# Patient Record
Sex: Female | Born: 1965 | Race: White | Hispanic: No | Smoking: Former smoker
Health system: Southern US, Community
[De-identification: ages and names within clinical notes are randomized; demographics above are authoritative.]

## PROBLEM LIST (undated history)

## (undated) DIAGNOSIS — T7840XA Allergy, unspecified, initial encounter: Secondary | ICD-10-CM

## (undated) DIAGNOSIS — IMO0002 Reserved for concepts with insufficient information to code with codable children: Secondary | ICD-10-CM

## (undated) DIAGNOSIS — R87810 Cervical high risk human papillomavirus (HPV) DNA test positive: Principal | ICD-10-CM

## (undated) DIAGNOSIS — F419 Anxiety disorder, unspecified: Secondary | ICD-10-CM

## (undated) DIAGNOSIS — J302 Other seasonal allergic rhinitis: Secondary | ICD-10-CM

## (undated) DIAGNOSIS — R87610 Atypical squamous cells of undetermined significance on cytologic smear of cervix (ASC-US): Secondary | ICD-10-CM

## (undated) HISTORY — PX: FOOT SURGERY: SHX648

## (undated) HISTORY — DX: Other seasonal allergic rhinitis: J30.2

## (undated) HISTORY — DX: Anxiety disorder, unspecified: F41.9

## (undated) HISTORY — DX: Allergy, unspecified, initial encounter: T78.40XA

## (undated) HISTORY — PX: GYNECOLOGIC CRYOSURGERY: SHX857

## (undated) HISTORY — DX: Cervical high risk human papillomavirus (HPV) DNA test positive: R87.810

## (undated) HISTORY — DX: Reserved for concepts with insufficient information to code with codable children: IMO0002

## (undated) HISTORY — PX: WISDOM TOOTH EXTRACTION: SHX21

## (undated) HISTORY — DX: Atypical squamous cells of undetermined significance on cytologic smear of cervix (ASC-US): R87.610

## (undated) NOTE — ED Notes (Signed)
 Formatting of this note might be different from the original. Norleen Resides : [phone]  Local friend, call if need anything.   Ferol Alstrom, RN 04/05/24 1306  Electronically signed by Ferol Alstrom, RN at 04/05/2024  1:06 PM EDT

## (undated) NOTE — ED Provider Notes (Signed)
 Formatting of this note is different from the original. HPI  Chief Complaint  Patient presents with   Vomiting    BIBA from [facility] for N/V/D since 5 am this morning. Pt is out of town from [facility], here for an event, was at the [employer] including meal, no other reports of illness. No abdominal surgical history. 18g IV RAC placed by EMS, 4 mg PO Zofran  and 4 mg IV  Zofran  PTA.   This is a 6 year old female who presents with nausea vomiting and diarrhea since early this morning, notes that she is here from out of town, and ate food with many other people at the [employer] last night, no one else she knows got sick, she denies any blood in her vomit or stool, she does endorse intermittent mid to right lower abdominal pain denies any pelvic or abdominal surgeries denies chills sweats chest pain shortness of breath.  Denies any recent significant foreign travel or antibiotic use, typically does not have GI issues.  Got Zofran  x 2 prior to my evaluation without relief of her nausea.  Patient History  History reviewed. No pertinent past medical history. History reviewed. No pertinent surgical history. No family history on file. Social History   Tobacco Use   Smoking status: Unknown   Smokeless tobacco: Not on file  Substance Use Topics   Alcohol  use: Not on file   Drug use: Not on file   Review of Systems  Review of Systems  All other systems reviewed and are negative.  Physical Exam  ED Triage Vitals [04/05/24 1129]  Temp Heart Rate Resp BP  36.6 C (97.8 F) 87 16 104/64   SpO2 Temp Source Heart Rate Source Patient Position  98 % Oral SpO2 Lying   BP Location FiO2 (%)    Right arm --     Physical Exam Vitals and nursing note reviewed.   Physical Exam: General: Patient is well-developed and well-nourished in no acute distress. HEENT: Normocephalic atraumatic. Airway patent.   Neck: Neck is supple. Cardiovascular: Warm and well perfused.  Regular rate and  rhythm Respiratory: Breathing comfortably without respiratory distress or tachypnea.   Abdomen: Soft, minimal tenderness in the right lower quadrant, nondistended Extremities: No cyanosis or edema.  Musculoskeletal: No gross deformities. Neuro: GCS 15.  No focal deficits apparent. Psych: General behavior and level of consciousness normal.  Skin: Warm and dry. No rashes or lesions noted.  Medical Decision Making 71 year old female coming in with nausea vomiting diarrhea since 5:00 this morning, mild lower abdominal pain primarily mid and right-sided, typically does not have GI issues, no history of abdominal or pelvic surgeries.  On exam she appears mildly fatigued but in no distress she has very minimal tenderness to the right lower quadrant, I have low suspicion for appendicitis at this could be colitis or other intra-abdominal pathology given the tenderness and the fact that she is still nauseous and vomiting after Reglan and Zofran  I did order CT scan.  I gave additional Compazine, CT scan does reveal a colitis, which I am going to cover with antibiotics in case it is bacterial given that she is no history of this, and after the third antiemetic patient is feeling improved clinically she is much better appearing she is able to take p.o. I am discharging her at this time with strict return precautions a prescription for ciprofloxacin  and Flagyl , I did discuss no alcohol  consumption while on Flagyl , and a prescription for Zofran .  She is going to  follow-up with her PCP at home.  No further events in my care.  Diagnoses as of 04/05/24 1720  Colitis             Glasgow Coma Scale Score: [address] MD 04/05/24 1723  Electronically signed by Heather Arlean Ponds, MD at 04/05/2024  5:23 PM EDT

---

## 1999-04-07 ENCOUNTER — Other Ambulatory Visit: Admission: RE | Admit: 1999-04-07 | Discharge: 1999-04-07 | Payer: Self-pay | Admitting: Gynecology

## 1999-08-26 ENCOUNTER — Encounter: Admission: RE | Admit: 1999-08-26 | Discharge: 1999-11-24 | Payer: Self-pay | Admitting: Gynecology

## 1999-11-07 ENCOUNTER — Observation Stay (HOSPITAL_COMMUNITY): Admission: AD | Admit: 1999-11-07 | Discharge: 1999-11-08 | Payer: Self-pay | Admitting: Gynecology

## 1999-11-10 ENCOUNTER — Inpatient Hospital Stay (HOSPITAL_COMMUNITY): Admission: AD | Admit: 1999-11-10 | Discharge: 1999-11-13 | Payer: Self-pay | Admitting: Gynecology

## 1999-11-16 ENCOUNTER — Encounter: Admission: RE | Admit: 1999-11-16 | Discharge: 2000-02-14 | Payer: Self-pay | Admitting: Gynecology

## 1999-12-23 ENCOUNTER — Other Ambulatory Visit: Admission: RE | Admit: 1999-12-23 | Discharge: 1999-12-23 | Payer: Self-pay | Admitting: Gynecology

## 2000-10-08 ENCOUNTER — Emergency Department (HOSPITAL_COMMUNITY): Admission: EM | Admit: 2000-10-08 | Discharge: 2000-10-08 | Payer: Self-pay | Admitting: Emergency Medicine

## 2001-01-12 ENCOUNTER — Other Ambulatory Visit: Admission: RE | Admit: 2001-01-12 | Discharge: 2001-01-12 | Payer: Self-pay | Admitting: Gynecology

## 2002-01-15 ENCOUNTER — Other Ambulatory Visit: Admission: RE | Admit: 2002-01-15 | Discharge: 2002-01-15 | Payer: Self-pay | Admitting: Gynecology

## 2002-10-07 ENCOUNTER — Inpatient Hospital Stay (HOSPITAL_COMMUNITY): Admission: AD | Admit: 2002-10-07 | Discharge: 2002-10-09 | Payer: Self-pay | Admitting: Gynecology

## 2002-11-20 ENCOUNTER — Other Ambulatory Visit: Admission: RE | Admit: 2002-11-20 | Discharge: 2002-11-20 | Payer: Self-pay | Admitting: Gynecology

## 2004-04-22 ENCOUNTER — Other Ambulatory Visit: Admission: RE | Admit: 2004-04-22 | Discharge: 2004-04-22 | Payer: Self-pay | Admitting: Gynecology

## 2004-11-06 ENCOUNTER — Other Ambulatory Visit: Admission: RE | Admit: 2004-11-06 | Discharge: 2004-11-06 | Payer: Self-pay | Admitting: Gynecology

## 2005-01-08 ENCOUNTER — Encounter (INDEPENDENT_AMBULATORY_CARE_PROVIDER_SITE_OTHER): Payer: Self-pay | Admitting: Specialist

## 2005-01-08 ENCOUNTER — Ambulatory Visit (HOSPITAL_COMMUNITY): Admission: RE | Admit: 2005-01-08 | Discharge: 2005-01-08 | Payer: Self-pay | Admitting: Gastroenterology

## 2005-04-26 ENCOUNTER — Other Ambulatory Visit: Admission: RE | Admit: 2005-04-26 | Discharge: 2005-04-26 | Payer: Self-pay | Admitting: Gynecology

## 2006-04-28 ENCOUNTER — Other Ambulatory Visit: Admission: RE | Admit: 2006-04-28 | Discharge: 2006-04-28 | Payer: Self-pay | Admitting: Gynecology

## 2007-05-11 ENCOUNTER — Other Ambulatory Visit: Admission: RE | Admit: 2007-05-11 | Discharge: 2007-05-11 | Payer: Self-pay | Admitting: Gynecology

## 2008-05-15 ENCOUNTER — Encounter: Payer: Self-pay | Admitting: Women's Health

## 2008-05-15 ENCOUNTER — Ambulatory Visit: Payer: Self-pay | Admitting: Women's Health

## 2008-05-15 ENCOUNTER — Other Ambulatory Visit: Admission: RE | Admit: 2008-05-15 | Discharge: 2008-05-15 | Payer: Self-pay | Admitting: Gynecology

## 2009-06-25 ENCOUNTER — Ambulatory Visit: Payer: Self-pay | Admitting: Women's Health

## 2009-06-25 ENCOUNTER — Encounter: Payer: Self-pay | Admitting: Women's Health

## 2009-06-25 ENCOUNTER — Other Ambulatory Visit: Admission: RE | Admit: 2009-06-25 | Discharge: 2009-06-25 | Payer: Self-pay | Admitting: Gynecology

## 2010-10-21 ENCOUNTER — Encounter: Payer: Self-pay | Admitting: Women's Health

## 2010-10-28 ENCOUNTER — Encounter: Payer: Self-pay | Admitting: Women's Health

## 2010-12-04 ENCOUNTER — Encounter: Payer: Self-pay | Admitting: Women's Health

## 2010-12-09 ENCOUNTER — Encounter: Payer: Self-pay | Admitting: Women's Health

## 2010-12-11 ENCOUNTER — Other Ambulatory Visit: Payer: Self-pay | Admitting: Women's Health

## 2010-12-11 ENCOUNTER — Other Ambulatory Visit (HOSPITAL_COMMUNITY)
Admission: RE | Admit: 2010-12-11 | Discharge: 2010-12-11 | Disposition: A | Discharge status: Home / Self Care | Payer: Commercial Managed Care - PPO | Source: Ambulatory Visit | Attending: Gynecology | Admitting: Gynecology

## 2010-12-11 ENCOUNTER — Encounter (INDEPENDENT_AMBULATORY_CARE_PROVIDER_SITE_OTHER): Payer: Commercial Managed Care - PPO | Admitting: Women's Health

## 2010-12-11 DIAGNOSIS — Z01419 Encounter for gynecological examination (general) (routine) without abnormal findings: Secondary | ICD-10-CM

## 2010-12-11 DIAGNOSIS — Z13220 Encounter for screening for lipoid disorders: Secondary | ICD-10-CM

## 2010-12-11 DIAGNOSIS — Z3041 Encounter for surveillance of contraceptive pills: Secondary | ICD-10-CM

## 2010-12-11 DIAGNOSIS — Z113 Encounter for screening for infections with a predominantly sexual mode of transmission: Secondary | ICD-10-CM

## 2010-12-11 DIAGNOSIS — R82998 Other abnormal findings in urine: Secondary | ICD-10-CM

## 2010-12-11 DIAGNOSIS — Z124 Encounter for screening for malignant neoplasm of cervix: Secondary | ICD-10-CM | POA: Insufficient documentation

## 2010-12-11 DIAGNOSIS — B373 Candidiasis of vulva and vagina: Secondary | ICD-10-CM

## 2011-01-29 NOTE — H&P (Signed)
[facility]  Patient:    Mackenzie Thomas, Mackenzie Thomas                       MRN: [id number] Adm. Date:  [id number] Disc. Date: [id number] Attending:  Merrily Pew                         History and Physical  CHIEF COMPLAINT:              Pregnancy at term, spontaneous rupture of membranes, early labor.  HISTORY OF PRESENT ILLNESS:   A 45 year old G1 P0 female at term gestation, admitted with history of spontaneous rupture of membranes, regular uterine contractions, and 3 cm dilatation.  She is admitted for labor and delivery. Patients prenatal course noted the patient is rubella negative and she does have a history of cryosurgery in the past.  Her prenatal course is otherwise uncomplicated, as evidenced by her accompanying [place] form.  PAST MEDICAL HISTORY:         Uncomplicated.  PAST SURGICAL HISTORY:        Includes cryosurgery to the cervix, orthopedic toe surgery, and oral surgery.  MEDICATIONS:                  Vitamins.  ALLERGIES:                    None.  REVIEW OF SYSTEMS:            Noncontributory.  FAMILY HISTORY:               Noncontributory.  ADMISSION PHYSICAL EXAMINATION:  VITAL SIGNS:                  Afebrile, vital signs stable.  HEENT:                        Normal.  LUNGS:                        Clear.  CARDIAC:                      Regular rate, no rubs, murmurs, or gallops.  ABDOMEN:                      Gravid, vertex uterus, reactive fetal tracing. Contractions every three minutes.  PELVIC:                       Complete, 6 cm, 0 station.  ASSESSMENT:                   A 45 year old G1 P0 female, term gestation, admitted a 3 cm dilatation with spontaneous rupture of membranes, regular contractions, ow in active labor at 6 cm with anticipated labor and delivery. DD:  12/02/99 TD:  12/02/99 Job: [id number]

## 2011-01-29 NOTE — Op Note (Signed)
NAME:  Mackenzie Thomas, CHAM NO.:  [id number]   MEDICAL RECORD NO.:  [id number]          PATIENT TYPE:  AMB   LOCATION:  ENDO                         FACILITY:  [facility]   PHYSICIAN:  Graylin Shiver, M.D.   DATE OF BIRTH:  1965-10-11   DATE OF PROCEDURE:  01/08/2005  DATE OF DISCHARGE:                                 OPERATIVE REPORT   PROCEDURE:  Esophagogastroduodenoscopy with biopsy.   INDICATIONS FOR PROCEDURE:  The patient is a 45 year old female who has been  experiencing odynophagia for the past 6 days.  Endoscopy is being done to  further evaluate.   Informed consent was obtained after explanation of the risks of bleeding,  infection and perforation.   PREMEDICATION:  1.  Fentanyl 75 mcg IV.  2.  Versed 6 mg IV.   PROCEDURE:  With the patient in the left lateral decubitus position, the  Olympus gastroscope was inserted into the oropharynx and passed into the  esophagus.  It was advanced down the esophagus then into the stomach and  into the duodenum.  The second portion and bulb of the duodenum looked  normal.  The stomach looked normal in its entirety including the upper  fundus and cardia seen on retroflexion.  In the esophagus, the  esophagogastric junction looked normal.  The esophageal mucosa in general  looked normal except around the area at 32 cm from the incisor teeth.  At  this point, there were several ulcers noted in the esophagus.  These were on  both sides of the esophagus, and two of the ulcers were right across from  each other.  One of the ulcers looked to be elongated and rather deep;  biopsies were obtained.  The etiology of these ulcers is clear.  I do not  believe they are due to reflux.  They could be due to a pill having been  stuck in this region.  Or perhaps other etiologies to consider would be a  herpetic esophageal ulcer.  The biopsies will be checked.  She tolerated the  procedure well without complications.   IMPRESSION:   Esophageal ulcers located around 32 cm from the incisor teeth.   PLAN:  The biopsies will be checked.  She will remain on Aciphex 20 mg  b.i.d. and Carafate.      SFG/MEDQ  D:  01/08/2005  T:  01/08/2005  Job:  [id number]   cc:   Caryn Bee L. Little, M.D.  [address]  [place] [address]  Fax: [phone]

## 2011-01-29 NOTE — Discharge Summary (Signed)
   Mackenzie Thomas, Mackenzie Thomas                          ACCOUNT NO.:  [id number]   MEDICAL RECORD NO.:  [id number]                   PATIENT TYPE:  INP   LOCATION:  9135                                 FACILITY:  [facility]   PHYSICIAN:  Katy Fitch, M.D.               DATE OF BIRTH:  [birth year]   DATE OF ADMISSION:  10/07/2002  DATE OF DISCHARGE:  10/09/2002                                 DISCHARGE SUMMARY   DISCHARGE DIAGNOSES:  1. Intrauterine pregnancy at 38 weeks, delivered.  2. Advanced maternal age.  3. The patient with IgA deficiency.  4. Status post spontaneous vaginal delivery.   HISTORY:  This is a 35-years-of-age female gravida 2 para 1 with an EDC by  sonogram of October 20, 2002.  Prenatal course had been complicated by  advanced maternal age.  The patient has declined amniocentesis.  The patient  also had a daughter who had recently been diagnosed prior to her pregnancy  with IgA deficiency and celiac disease.  The patient herself underwent IgA  testing and she was found to be IgA deficient and she was to follow-up with  physicians at [facility] for same.   HOSPITAL COURSE:  On October 07, 2002 the patient presented at 38+ weeks  with contractions since 1645.  Upon arrival to triage she was 7 cm and  subsequently at 1858 underwent a spontaneous vaginal delivery of a female,  Apgars of 9 and 9, weight of 7 pounds 15 ounces.  There was a midline  episiotomy with third degree extension which was repaired.  It was noted  that the lower lip with abrasion noted at delivery, a small amount of facial  bruising.  Postpartum, the patient remained afebrile, voiding, in stable  condition.  She was discharged to home in satisfactory condition on October 09, 2002.   ACCESSORY CLINICAL FINDINGS/LABORATORY DATA:  The patient is O positive.  Rubella immune.  On October 08, 2002 hemoglobin was 8.2.   DISPOSITION:  1. The patient was discharged to home.  2. Informed to return to the office in six  weeks.  3. If she had any problem prior to that time to be seen in the office.     Susa Loffler, P.A.                    Katy Fitch, M.D.    Ardath Sax  D:  11/02/2002  T:  11/02/2002  Job:  [id number]

## 2011-01-29 NOTE — Discharge Summary (Signed)
[facility]  Patient:    Mackenzie Thomas, Mackenzie Thomas                       MRN: [id number] Adm. Date:  [id number] Disc. Date: [id number] Attending:  Merrily Pew Dictator:   Antony Contras, [facility]                           Discharge Summary  DISCHARGE DIAGNOSES:              Intrauterine pregnancy at term, with delivery of a viable infant.  PROCEDURES:                       1. Low forceps delivery over a midline                                      episiotomy repair.                                   2. Left labial repair secondary to                                      laceration.  HISTORY OF PRESENT ILLNESS:       The patient is a 45 year old gravida 1, para 0 whose prenatal course was uncomplicated, except for the fact that she is Rubella negative and does have a history of cryosurgery in the past.  PRENATAL LABS:                    Blood Type -- 0 positive.  Antibody Screen -- Negative.  Rubella -- Negative.  RPR, HVS, HCG, HIV -- Negative.  MSAFP within normal limits.  GBS -- Negative.  HOSPITAL COURSE AND TREATMENT:  The patient was admitted on November 11, 1999 with spontaneous rupture of membranes of 3 cm dilatation.  She did sustain some occurrence of deceleration with a late component with a contraction. After actively pushing for two hours she did progress to a +2 station ROA. She was offered the option of low forceps delivery versus cesarean section, and agreed to ______ ______ ______ ______ hospital vaginal sulcul lacerations, bladder dysostosis, fetal scalp laceration.  A low-forceps delivery was accomplished over midline episiotomy by Dr. Douglass Rivers; infant at Apgar 7/9 female, 7 pounds.  Second degree midline episiotomy and a left labial laceration were repaired.  Postpartum course was complicated by difficulty voiding.  The patient did require a Foley catheter initially.  ______ ______ ______ Postpartum CBC -- Hematocrit 30.2,  Hemoglobin 10.5, WBC 13.8, platelets 186.  She was able to be discharged on her second postpartum day.  She did receive a Rubella vaccine prior to discharge.  CONDITION ON DISCHARGE:           She and her infant were discharged in satisfactory condition.  DISPOSITION:                      Follow up at [facility] in six weeks.  Continue with Motrin and Tylox for pain.  Continue with prenatal vitamins and iron. DD:  12/18/99 TD:  12/21/99 Job: [id number]

## 2011-02-10 ENCOUNTER — Emergency Department (HOSPITAL_COMMUNITY)
Admission: EM | Admit: 2011-02-10 | Discharge: 2011-02-10 | Disposition: A | Discharge status: Home / Self Care | Payer: Commercial Managed Care - PPO | Attending: Emergency Medicine | Admitting: Emergency Medicine

## 2011-02-10 DIAGNOSIS — Z203 Contact with and (suspected) exposure to rabies: Secondary | ICD-10-CM | POA: Insufficient documentation

## 2011-02-10 DIAGNOSIS — Z23 Encounter for immunization: Secondary | ICD-10-CM | POA: Insufficient documentation

## 2011-02-13 ENCOUNTER — Inpatient Hospital Stay (INDEPENDENT_AMBULATORY_CARE_PROVIDER_SITE_OTHER)
Admission: RE | Admit: 2011-02-13 | Discharge: 2011-02-13 | Disposition: A | Discharge status: Home / Self Care | Payer: Commercial Managed Care - PPO | Source: Ambulatory Visit | Attending: Family Medicine | Admitting: Family Medicine

## 2011-02-13 DIAGNOSIS — Z23 Encounter for immunization: Secondary | ICD-10-CM

## 2011-02-18 ENCOUNTER — Inpatient Hospital Stay (INDEPENDENT_AMBULATORY_CARE_PROVIDER_SITE_OTHER)
Admission: RE | Admit: 2011-02-18 | Discharge: 2011-02-18 | Disposition: A | Discharge status: Home / Self Care | Payer: Commercial Managed Care - PPO | Source: Ambulatory Visit | Attending: Emergency Medicine | Admitting: Emergency Medicine

## 2011-02-18 DIAGNOSIS — Z23 Encounter for immunization: Secondary | ICD-10-CM

## 2011-02-24 ENCOUNTER — Inpatient Hospital Stay (INDEPENDENT_AMBULATORY_CARE_PROVIDER_SITE_OTHER)
Admission: RE | Admit: 2011-02-24 | Discharge: 2011-02-24 | Disposition: A | Discharge status: Home / Self Care | Payer: Commercial Managed Care - PPO | Source: Ambulatory Visit | Attending: Family Medicine | Admitting: Family Medicine

## 2011-02-24 DIAGNOSIS — Z23 Encounter for immunization: Secondary | ICD-10-CM

## 2011-03-10 ENCOUNTER — Ambulatory Visit (INDEPENDENT_AMBULATORY_CARE_PROVIDER_SITE_OTHER): Payer: Commercial Managed Care - PPO | Admitting: Women's Health

## 2011-03-10 DIAGNOSIS — R82998 Other abnormal findings in urine: Secondary | ICD-10-CM

## 2011-03-10 DIAGNOSIS — R350 Frequency of micturition: Secondary | ICD-10-CM

## 2011-04-20 ENCOUNTER — Ambulatory Visit (INDEPENDENT_AMBULATORY_CARE_PROVIDER_SITE_OTHER): Payer: Commercial Managed Care - PPO | Admitting: Gynecology

## 2011-04-20 ENCOUNTER — Encounter: Payer: Self-pay | Admitting: Gynecology

## 2011-04-20 DIAGNOSIS — R82998 Other abnormal findings in urine: Secondary | ICD-10-CM

## 2011-04-20 DIAGNOSIS — R350 Frequency of micturition: Secondary | ICD-10-CM

## 2011-04-20 DIAGNOSIS — N898 Other specified noninflammatory disorders of vagina: Secondary | ICD-10-CM

## 2011-04-20 DIAGNOSIS — R300 Dysuria: Secondary | ICD-10-CM

## 2011-04-20 DIAGNOSIS — N760 Acute vaginitis: Secondary | ICD-10-CM

## 2011-04-20 DIAGNOSIS — B373 Candidiasis of vulva and vagina: Secondary | ICD-10-CM

## 2011-04-20 DIAGNOSIS — N390 Urinary tract infection, site not specified: Secondary | ICD-10-CM

## 2011-04-20 MED ORDER — TERCONAZOLE 0.8 % VA CREA: 1.0000 | TOPICAL_CREAM | Freq: Every day | VAGINAL | Status: AC

## 2011-04-20 MED ORDER — TINIDAZOLE 500 MG PO TABS: 2.0000 g | ORAL_TABLET | Freq: Once | ORAL | Status: AC

## 2011-04-20 MED ORDER — CIPROFLOXACIN HCL 250 MG PO TABS: 250.0000 mg | ORAL_TABLET | Freq: Two times a day (BID) | ORAL | Status: AC

## 2011-04-20 NOTE — Progress Notes (Signed)
Patient presents complaining of slight vaginal discharge and dysuria began the end of last week. She had some Diflucan at home she took 3 days of Diflucan her symptoms seemed to get worse and now she has dysuria frequency low back pain low-grade fever of 100.4 last night.  Exam Lower back without CVA tenderness Abdomen soft nontender without masses guarding rebound organomegaly Pelvic external BUS vagina with white discharge KOH wet prep done cervix normal bimanual uterus normal size midline mobile nontender adnexa without masses or tenderness  Wet prep positive for yeast and BV, UA positive for UTI will check culture  Assessment and plan #1 UTI low-grade fever we'll treat with Cipro 250 mg twice a day x7 days followup if symptoms persist or recur  #2 yeast and BV we'll treat with Terazol 3 day cream and Tindamax 2 g daily x2 days alcohol avoidance reviewed followup if symptoms persist or recur

## 2011-04-20 NOTE — Progress Notes (Signed)
Addended by: Landis Martins R on: 04/20/2011 12:20 PM   Modules accepted: Orders

## 2011-04-22 ENCOUNTER — Emergency Department (HOSPITAL_COMMUNITY)
Admission: EM | Admit: 2011-04-22 | Discharge: 2011-04-22 | Disposition: A | Discharge status: Home / Self Care | Payer: Commercial Managed Care - PPO | Attending: Emergency Medicine | Admitting: Emergency Medicine

## 2011-04-22 ENCOUNTER — Encounter (HOSPITAL_COMMUNITY): Payer: Self-pay

## 2011-04-22 ENCOUNTER — Emergency Department (HOSPITAL_COMMUNITY): Payer: Commercial Managed Care - PPO

## 2011-04-22 DIAGNOSIS — F329 Major depressive disorder, single episode, unspecified: Secondary | ICD-10-CM | POA: Insufficient documentation

## 2011-04-22 DIAGNOSIS — N12 Tubulo-interstitial nephritis, not specified as acute or chronic: Secondary | ICD-10-CM | POA: Insufficient documentation

## 2011-04-22 DIAGNOSIS — F3289 Other specified depressive episodes: Secondary | ICD-10-CM | POA: Insufficient documentation

## 2011-04-22 DIAGNOSIS — R509 Fever, unspecified: Secondary | ICD-10-CM | POA: Insufficient documentation

## 2011-04-22 DIAGNOSIS — Z79899 Other long term (current) drug therapy: Secondary | ICD-10-CM | POA: Insufficient documentation

## 2011-04-22 DIAGNOSIS — R110 Nausea: Secondary | ICD-10-CM | POA: Insufficient documentation

## 2011-04-22 LAB — URINALYSIS, ROUTINE W REFLEX MICROSCOPIC
Bilirubin Urine: NEGATIVE
Glucose, UA: NEGATIVE mg/dL
Hgb urine dipstick: NEGATIVE
Specific Gravity, Urine: 1.008 (ref 1.005–1.030)
Urobilinogen, UA: 0.2 mg/dL (ref 0.0–1.0)
pH: 6.5 (ref 5.0–8.0)

## 2011-04-22 LAB — POCT I-STAT, CHEM 8
BUN: 8 mg/dL (ref 6–23)
Calcium, Ion: 1.11 mmol/L — ABNORMAL LOW (ref 1.12–1.32)
Chloride: 100 mEq/L (ref 96–112)

## 2011-04-22 LAB — URINE MICROSCOPIC-ADD ON

## 2011-04-22 LAB — DIFFERENTIAL
Basophils Absolute: 0 10*3/uL (ref 0.0–0.1)
Eosinophils Absolute: 0 10*3/uL (ref 0.0–0.7)
Lymphocytes Relative: 8 % — ABNORMAL LOW (ref 12–46)
Neutrophils Relative %: 84 % — ABNORMAL HIGH (ref 43–77)

## 2011-04-22 LAB — HEPATIC FUNCTION PANEL
ALT: 13 U/L (ref 0–35)
AST: 18 U/L (ref 0–37)
Albumin: 3 g/dL — ABNORMAL LOW (ref 3.5–5.2)
Indirect Bilirubin: 0.2 mg/dL — ABNORMAL LOW (ref 0.3–0.9)
Total Protein: 7.8 g/dL (ref 6.0–8.3)

## 2011-04-22 LAB — CBC
MCH: 30.1 pg (ref 26.0–34.0)
Platelets: 192 10*3/uL (ref 150–400)
RBC: 3.82 MIL/uL — ABNORMAL LOW (ref 3.87–5.11)
WBC: 13.9 10*3/uL — ABNORMAL HIGH (ref 4.0–10.5)

## 2011-04-22 MED ORDER — IOHEXOL 300 MG/ML  SOLN
100.0000 mL | Freq: Once | INTRAMUSCULAR | Status: AC | PRN
  Administered 2011-04-22: 100 mL via INTRAVENOUS

## 2011-04-23 LAB — URINE CULTURE
Colony Count: NO GROWTH
Culture: NO GROWTH

## 2011-06-08 ENCOUNTER — Other Ambulatory Visit: Payer: Self-pay | Admitting: *Deleted

## 2011-06-09 MED ORDER — ALPRAZOLAM 0.5 MG PO TABS: 0.5000 mg | ORAL_TABLET | ORAL | Status: DC | PRN

## 2011-07-09 ENCOUNTER — Other Ambulatory Visit: Payer: Self-pay | Admitting: Women's Health

## 2011-07-09 ENCOUNTER — Telehealth: Payer: Self-pay | Admitting: *Deleted

## 2011-07-09 DIAGNOSIS — IMO0001 Reserved for inherently not codable concepts without codable children: Secondary | ICD-10-CM

## 2011-07-09 MED ORDER — DROSPIRENONE-ETHINYL ESTRADIOL 3-0.02 MG PO TABS: 1.0000 | ORAL_TABLET | Freq: Every day | ORAL | Status: DC

## 2011-07-09 NOTE — Telephone Encounter (Signed)
Patient called c/o problems with oc's for the last couple months she wants to discuss.

## 2011-07-09 NOTE — Telephone Encounter (Signed)
States has had  problems with the Microgestin with a heavier, longer cycle. Would like to go back on Yaz. Did review slight increased risk for blood clots and strokes. Had no problems in the past while on it. Will start back on finishes current pack. Will call if no relief of symptoms.

## 2011-08-12 ENCOUNTER — Other Ambulatory Visit: Payer: Self-pay

## 2011-08-12 DIAGNOSIS — IMO0001 Reserved for inherently not codable concepts without codable children: Secondary | ICD-10-CM

## 2011-08-12 MED ORDER — DROSPIRENONE-ETHINYL ESTRADIOL 3-0.02 MG PO TABS: 1.0000 | ORAL_TABLET | Freq: Every day | ORAL | Status: DC

## 2011-08-17 MED ORDER — DROSPIRENONE-ETHINYL ESTRADIOL 3-0.02 MG PO TABS: 1.0000 | ORAL_TABLET | Freq: Every day | ORAL | Status: DC

## 2011-08-17 NOTE — Telephone Encounter (Signed)
FAX SENT FOR CLARIFICATION FROM [facility].

## 2011-08-17 NOTE — Telephone Encounter (Signed)
Addended by: Venora Maples on: 08/17/2011 11:01 AM   Modules accepted: Orders

## 2011-09-02 ENCOUNTER — Other Ambulatory Visit: Payer: Self-pay | Admitting: *Deleted

## 2011-09-02 MED ORDER — ALPRAZOLAM 0.5 MG PO TABS: 0.5000 mg | ORAL_TABLET | ORAL | Status: DC | PRN

## 2011-09-02 NOTE — Telephone Encounter (Signed)
rx called in

## 2011-10-04 ENCOUNTER — Other Ambulatory Visit: Payer: Self-pay | Admitting: *Deleted

## 2011-10-04 MED ORDER — CITALOPRAM HYDROBROMIDE 20 MG PO TABS: 20.0000 mg | ORAL_TABLET | Freq: Every day | ORAL | Status: DC

## 2011-12-20 ENCOUNTER — Other Ambulatory Visit: Payer: Self-pay | Admitting: *Deleted

## 2011-12-20 MED ORDER — ALPRAZOLAM 0.5 MG PO TABS: 0.5000 mg | ORAL_TABLET | ORAL | Status: DC | PRN

## 2011-12-20 NOTE — Telephone Encounter (Signed)
rx called in

## 2012-01-05 ENCOUNTER — Encounter: Payer: Commercial Managed Care - PPO | Admitting: Women's Health

## 2012-01-10 ENCOUNTER — Encounter: Payer: Commercial Managed Care - PPO | Admitting: Women's Health

## 2012-01-24 ENCOUNTER — Other Ambulatory Visit: Payer: Self-pay | Admitting: *Deleted

## 2012-02-18 ENCOUNTER — Telehealth: Payer: Self-pay | Admitting: *Deleted

## 2012-02-18 DIAGNOSIS — IMO0001 Reserved for inherently not codable concepts without codable children: Secondary | ICD-10-CM

## 2012-02-18 MED ORDER — DROSPIRENONE-ETHINYL ESTRADIOL 3-0.02 MG PO TABS: 1.0000 | ORAL_TABLET | Freq: Every day | ORAL | Status: DC

## 2012-02-18 NOTE — Telephone Encounter (Signed)
Pt has annual schedule on 02/23/12 and will need 1 pack of her generic yaz sent to pharmacy. 1 pack sent.

## 2012-02-23 ENCOUNTER — Ambulatory Visit (INDEPENDENT_AMBULATORY_CARE_PROVIDER_SITE_OTHER): Payer: Commercial Managed Care - PPO | Admitting: Women's Health

## 2012-02-23 ENCOUNTER — Encounter: Payer: Self-pay | Admitting: Women's Health

## 2012-02-23 VITALS — BP 118/78 | Ht 64.25 in | Wt 136.0 lb

## 2012-02-23 DIAGNOSIS — B9689 Other specified bacterial agents as the cause of diseases classified elsewhere: Secondary | ICD-10-CM

## 2012-02-23 DIAGNOSIS — E079 Disorder of thyroid, unspecified: Secondary | ICD-10-CM

## 2012-02-23 DIAGNOSIS — N760 Acute vaginitis: Secondary | ICD-10-CM

## 2012-02-23 DIAGNOSIS — IMO0001 Reserved for inherently not codable concepts without codable children: Secondary | ICD-10-CM

## 2012-02-23 DIAGNOSIS — Z01419 Encounter for gynecological examination (general) (routine) without abnormal findings: Secondary | ICD-10-CM

## 2012-02-23 DIAGNOSIS — A499 Bacterial infection, unspecified: Secondary | ICD-10-CM

## 2012-02-23 DIAGNOSIS — G4700 Insomnia, unspecified: Secondary | ICD-10-CM

## 2012-02-23 DIAGNOSIS — F419 Anxiety disorder, unspecified: Secondary | ICD-10-CM | POA: Insufficient documentation

## 2012-02-23 DIAGNOSIS — Z309 Encounter for contraceptive management, unspecified: Secondary | ICD-10-CM

## 2012-02-23 DIAGNOSIS — F411 Generalized anxiety disorder: Secondary | ICD-10-CM

## 2012-02-23 DIAGNOSIS — N898 Other specified noninflammatory disorders of vagina: Secondary | ICD-10-CM

## 2012-02-23 LAB — CBC WITH DIFFERENTIAL/PLATELET
Basophils Absolute: 0 10*3/uL (ref 0.0–0.1)
Basophils Relative: 0 % (ref 0–1)
Eosinophils Absolute: 0.1 10*3/uL (ref 0.0–0.7)
Eosinophils Relative: 2 % (ref 0–5)
HCT: 38.4 % (ref 36.0–46.0)
Hemoglobin: 13.2 g/dL (ref 12.0–15.0)
Lymphocytes Relative: 18 % (ref 12–46)
Lymphs Abs: 1.3 10*3/uL (ref 0.7–4.0)
MCH: 30.6 pg (ref 26.0–34.0)
MCHC: 34.4 g/dL (ref 30.0–36.0)
MCV: 89.1 fL (ref 78.0–100.0)
Monocytes Absolute: 0.4 10*3/uL (ref 0.1–1.0)
Monocytes Relative: 5 % (ref 3–12)
Neutro Abs: 5.5 10*3/uL (ref 1.7–7.7)
Neutrophils Relative %: 75 % (ref 43–77)
Platelets: 292 10*3/uL (ref 150–400)
RBC: 4.31 MIL/uL (ref 3.87–5.11)
RDW: 13.2 % (ref 11.5–15.5)
WBC: 7.4 10*3/uL (ref 4.0–10.5)

## 2012-02-23 LAB — WET PREP FOR TRICH, YEAST, CLUE
Trich, Wet Prep: NONE SEEN
Yeast Wet Prep HPF POC: NONE SEEN

## 2012-02-23 LAB — TSH: TSH: 0.828 u[IU]/mL (ref 0.350–4.500)

## 2012-02-23 MED ORDER — DROSPIRENONE-ETHINYL ESTRADIOL 3-0.02 MG PO TABS: 1.0000 | ORAL_TABLET | Freq: Every day | ORAL | Status: DC

## 2012-02-23 MED ORDER — CITALOPRAM HYDROBROMIDE 20 MG PO TABS: 20.0000 mg | ORAL_TABLET | Freq: Every day | ORAL | Status: DC

## 2012-02-23 MED ORDER — ZOLPIDEM TARTRATE 10 MG PO TABS: 10.0000 mg | ORAL_TABLET | Freq: Every evening | ORAL | Status: DC | PRN

## 2012-02-23 MED ORDER — ALPRAZOLAM 0.5 MG PO TABS: 0.5000 mg | ORAL_TABLET | ORAL | Status: DC | PRN

## 2012-02-23 MED ORDER — METRONIDAZOLE 0.75 % VA GEL: VAGINAL | Status: AC

## 2012-02-23 NOTE — Progress Notes (Signed)
Mackenzie Thomas Nov 13, 1965 [id number]    History:    The patient presents for annual exam.  Monthly 5 day cycle on Yaz. Same partner for two-years/ negative STD screen. History of cryo- in 1998 with normal Paps after. Normal mammograms. History of anxiety, Celexa 20 daily and Xanax 0.5 with flying and occasionally for rest.   Past medical history, past surgical history, family history and social history were all reviewed and documented in the EPIC chart. [name] 12, type 1 diabetes with celiac disease, planning to get a pump the summer. [name] 9, doing well.   ROS:  A  ROS was performed and pertinent positives and negatives are included in the history.  Exam:  Filed Vitals:   02/23/12 1112  BP: 118/78    General appearance:  Normal Head/Neck:  Normal, without cervical or supraclavicular adenopathy. Thyroid:  Symmetrical, normal in size, without palpable masses or nodularity. Respiratory  Effort:  Normal  Auscultation:  Clear without wheezing or rhonchi Cardiovascular  Auscultation:  Regular rate, without rubs, murmurs or gallops  Edema/varicosities:  Not grossly evident Abdominal  Soft,nontender, without masses, guarding or rebound.  Liver/spleen:  No organomegaly noted  Hernia:  None appreciated  Skin  Inspection:  Grossly normal  Palpation:  Grossly normal Neurologic/psychiatric  Orientation:  Normal with appropriate conversation.  Mood/affect:  Normal  Genitourinary    Breasts: Examined lying and sitting.     Right: Without masses, retractions, discharge or axillary adenopathy.     Left: Without masses, retractions, discharge or axillary adenopathy.   Inguinal/mons:  Normal without inguinal adenopathy  External genitalia:  Normal  BUS/Urethra/Skene's glands:  Normal  Bladder:  Normal  Vagina:  Normal  Cervix:  Normal  Uterus:   normal in size, shape and contour.  Midline and mobile  Adnexa/parametria:     Rt: Without masses or tenderness.   Lt: Without masses or  tenderness.  Anus and perineum: Normal  Digital rectal exam: Normal sphincter tone without palpated masses or tenderness  Assessment/Plan:  46 y.o. DWF G2 P2 for annual exam with no complaints.  Normal GYN exam on Yaz Anxiety stable on Celexa 20 mg BV  Plan: MetroGel vaginal cream 1 applicator at bedtime x5, alcohol precautions reviewed. SBE's, continue annual mammogram, calcium rich diet, vitamin D 1000 daily, exercise, sunscreen encouraged. CBC, TSH, UA. Excellent lipid panel 2012. History of normal Paps since 98, no Pap taken. New screening guidelines reviewed. Yaz prescription, proper use, slight risk for blood clots and strokes reviewed, prefers to continue on,  did not feel as well on Loestrin in the past. Celexa 20 mg by mouth daily prescription, proper use, given and reviewed. Denies need for counseling at this time. Xanax 0.5 at bedtime when necessary and when flying. Reviewed addictive properties is aware to use sparingly.    Harrington Challenger [facility], 1:11 PM 02/23/2012

## 2012-02-23 NOTE — Patient Instructions (Signed)

## 2012-02-24 LAB — URINALYSIS W MICROSCOPIC + REFLEX CULTURE
Bacteria, UA: NONE SEEN
Casts: NONE SEEN
Glucose, UA: NEGATIVE mg/dL
Ketones, ur: NEGATIVE mg/dL
pH: 6 (ref 5.0–8.0)

## 2012-02-25 LAB — URINE CULTURE

## 2012-03-06 ENCOUNTER — Telehealth: Payer: Self-pay | Admitting: *Deleted

## 2012-03-06 NOTE — Telephone Encounter (Signed)
Pt called pharmacy never received Rx for Ambien 10 mg tablets Rx called in.

## 2012-04-20 ENCOUNTER — Telehealth: Payer: Self-pay | Admitting: Women's Health

## 2012-04-20 ENCOUNTER — Other Ambulatory Visit: Payer: Self-pay | Admitting: Women's Health

## 2012-04-20 DIAGNOSIS — F419 Anxiety disorder, unspecified: Secondary | ICD-10-CM

## 2012-04-20 MED ORDER — CITALOPRAM HYDROBROMIDE 20 MG PO TABS: 20.0000 mg | ORAL_TABLET | Freq: Every day | ORAL | Status: DC

## 2012-04-20 NOTE — Telephone Encounter (Signed)
Patient is calling from [facility] on [address]. The pharmacist is telling her she has no refills left on her Celexa (generic).  She was just in in June for [facility] with [place] ordered #90 with four refills but obviously [facility] did not receive electronic order.  The pharmacy had faxed a refill request for #30 so I okayed it and four refills.

## 2012-05-10 ENCOUNTER — Other Ambulatory Visit: Payer: Self-pay | Admitting: *Deleted

## 2012-05-10 DIAGNOSIS — F419 Anxiety disorder, unspecified: Secondary | ICD-10-CM

## 2012-05-10 MED ORDER — ALPRAZOLAM 0.5 MG PO TABS: 0.5000 mg | ORAL_TABLET | ORAL | Status: DC | PRN

## 2012-05-10 NOTE — Telephone Encounter (Signed)
Last fill date 02/24/12

## 2012-07-21 ENCOUNTER — Other Ambulatory Visit: Payer: Self-pay | Admitting: Women's Health

## 2012-07-21 DIAGNOSIS — F419 Anxiety disorder, unspecified: Secondary | ICD-10-CM

## 2012-07-21 MED ORDER — ALPRAZOLAM 0.5 MG PO TABS: 0.5000 mg | ORAL_TABLET | ORAL | Status: DC | PRN

## 2012-07-21 NOTE — Telephone Encounter (Signed)
Called into pharmacy

## 2012-08-09 ENCOUNTER — Encounter: Payer: Self-pay | Admitting: Women's Health

## 2012-08-16 ENCOUNTER — Encounter: Payer: Self-pay | Admitting: Women's Health

## 2012-12-20 ENCOUNTER — Telehealth: Payer: Self-pay | Admitting: *Deleted

## 2012-12-20 NOTE — Telephone Encounter (Signed)
Pt will be leaving for a golf trip and would like to skip her cycle for trip. I explained to pt just skip placebo pills and start new pack (active pills) and no cycle will start. Pt will do this to skip cycle.

## 2012-12-21 ENCOUNTER — Telehealth: Payer: Self-pay | Admitting: *Deleted

## 2012-12-21 NOTE — Telephone Encounter (Signed)
Okay to fill Ambien 10 mg at bedtime when necessary #30 with 1 refill.

## 2012-12-21 NOTE — Telephone Encounter (Signed)
rx called in

## 2012-12-21 NOTE — Telephone Encounter (Signed)
PHARMACY FAXED OVER REQUEST FOR ZOLPIDEM 10 MG TABLETS. OKAY TO FILL? ANNUAL DUE IN June. PLEASE ADVISE

## 2013-01-29 ENCOUNTER — Other Ambulatory Visit: Payer: Self-pay | Admitting: Women's Health

## 2013-01-29 ENCOUNTER — Telehealth: Payer: Self-pay | Admitting: *Deleted

## 2013-01-29 MED ORDER — CITALOPRAM HYDROBROMIDE 40 MG PO TABS: 40.0000 mg | ORAL_TABLET | Freq: Every day | ORAL | Status: DC

## 2013-01-29 NOTE — Telephone Encounter (Signed)
Telephone call, states has been more short tempered than her usual has been going on for several months, states life is good,  new job going well,  Shanda Bumps with diabetes is doing well with pump. Has been on Celexa 20 for several years and would like to try higher dose. Annual is scheduled next month will try Celexa 40, denies need for counseling has had in the past.

## 2013-01-29 NOTE — Telephone Encounter (Signed)
Pt called requesting to speak with you about a increase on celexa 20 mg tablets. Pt call back # [phone].

## 2013-02-23 ENCOUNTER — Encounter: Payer: Self-pay | Admitting: Women's Health

## 2013-02-28 ENCOUNTER — Other Ambulatory Visit: Payer: Self-pay | Admitting: Women's Health

## 2013-03-08 ENCOUNTER — Encounter: Payer: Self-pay | Admitting: Women's Health

## 2013-03-28 ENCOUNTER — Encounter: Payer: Self-pay | Admitting: Women's Health

## 2013-03-28 ENCOUNTER — Ambulatory Visit (INDEPENDENT_AMBULATORY_CARE_PROVIDER_SITE_OTHER): Payer: BC Managed Care – PPO | Admitting: Women's Health

## 2013-03-28 ENCOUNTER — Other Ambulatory Visit (HOSPITAL_COMMUNITY)
Admission: RE | Admit: 2013-03-28 | Discharge: 2013-03-28 | Disposition: A | Discharge status: Home / Self Care | Payer: Self-pay | Source: Ambulatory Visit | Attending: Women's Health | Admitting: Women's Health

## 2013-03-28 VITALS — BP 104/74 | Ht 65.0 in | Wt 134.0 lb

## 2013-03-28 DIAGNOSIS — R87810 Cervical high risk human papillomavirus (HPV) DNA test positive: Secondary | ICD-10-CM | POA: Insufficient documentation

## 2013-03-28 DIAGNOSIS — Z309 Encounter for contraceptive management, unspecified: Secondary | ICD-10-CM

## 2013-03-28 DIAGNOSIS — Z1151 Encounter for screening for human papillomavirus (HPV): Secondary | ICD-10-CM | POA: Insufficient documentation

## 2013-03-28 DIAGNOSIS — IMO0001 Reserved for inherently not codable concepts without codable children: Secondary | ICD-10-CM

## 2013-03-28 DIAGNOSIS — F411 Generalized anxiety disorder: Secondary | ICD-10-CM

## 2013-03-28 DIAGNOSIS — F419 Anxiety disorder, unspecified: Secondary | ICD-10-CM

## 2013-03-28 DIAGNOSIS — Z01419 Encounter for gynecological examination (general) (routine) without abnormal findings: Secondary | ICD-10-CM

## 2013-03-28 MED ORDER — ALPRAZOLAM 0.5 MG PO TABS: 0.5000 mg | ORAL_TABLET | ORAL | Status: DC | PRN

## 2013-03-28 MED ORDER — CITALOPRAM HYDROBROMIDE 40 MG PO TABS: 40.0000 mg | ORAL_TABLET | Freq: Every day | ORAL | Status: DC

## 2013-03-28 MED ORDER — DROSPIRENONE-ETHINYL ESTRADIOL 3-0.02 MG PO TABS: ORAL_TABLET | ORAL | Status: DC

## 2013-03-28 NOTE — Patient Instructions (Addendum)

## 2013-03-28 NOTE — Addendum Note (Signed)
Addended by: Richardson Chiquito on: 03/28/2013 11:58 AM   Modules accepted: Orders

## 2013-03-28 NOTE — Progress Notes (Signed)
Mackenzie Thomas 09/16/1965 [id number]    History:    The patient presents for annual exam.  Monthly cycle on Yaz without complaint. Anxiety/depression Celexa 40 mg doing well. Takes an occasional Xanax. Normal  mammogram history. Same partner. Cryo- 1998 with normal Paps after.   Past medical history, past surgical history, family history and social history were all reviewed and documented in the EPIC chart. Works for an Adult nurse. [name] 13 type 1 diabetes doing well, [name] 10.   ROS:  A  ROS was performed and pertinent positives and negatives are included in the history.  Exam:  Filed Vitals:   03/28/13 1004  BP: 104/74    General appearance:  Normal Head/Neck:  Normal, without cervical or supraclavicular adenopathy. Thyroid:  Symmetrical, normal in size, without palpable masses or nodularity. Respiratory  Effort:  Normal  Auscultation:  Clear without wheezing or rhonchi Cardiovascular  Auscultation:  Regular rate, without rubs, murmurs or gallops  Edema/varicosities:  Not grossly evident Abdominal  Soft,nontender, without masses, guarding or rebound.  Liver/spleen:  No organomegaly noted  Hernia:  None appreciated  Skin  Inspection:  Grossly normal  Palpation:  Grossly normal Neurologic/psychiatric  Orientation:  Normal with appropriate conversation.  Mood/affect:  Normal  Genitourinary    Breasts: Examined lying and sitting.     Right: Without masses, retractions, discharge or axillary adenopathy.     Left: Without masses, retractions, discharge or axillary adenopathy.   Inguinal/mons:  Normal without inguinal adenopathy  External genitalia:  Normal  BUS/Urethra/Skene's glands:  Normal  Bladder:  Normal  Vagina:  Normal  Cervix:  Normal  Uterus:   normal in size, shape and contour.  Midline and mobile  Adnexa/parametria:     Rt: Without masses or tenderness.   Lt: Without masses or tenderness.  Anus and perineum: Normal  Digital rectal exam: Normal  sphincter tone without palpated masses or tenderness  Assessment/Plan:  47 y.o. DWF G2P2 for annual exam with no complaints.  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 Audie Box would do as an outpatient, procedure reviewed, will review with partner possible vasectomy. Yaz prescription, proper use given and reviewed  risk for blood clots and strokes. Celexa 40 mg by mouth daily, prescription, proper use given and reviewed. Uses Xanax 0.5 sparingly, is aware of addictive properties. Has had counseling denies need at this time. Labs at primary care. Pap. Paps normal 2012, new screening guidelines reviewed. SBE's, continue annual mammogram, calcium rich diet, vitamin D 1000 daily, regular exercise encouraged.    Harrington Challenger [facility], 10:43 AM 03/28/2013

## 2013-04-13 DIAGNOSIS — R87610 Atypical squamous cells of undetermined significance on cytologic smear of cervix (ASC-US): Secondary | ICD-10-CM

## 2013-04-13 HISTORY — DX: Atypical squamous cells of undetermined significance on cytologic smear of cervix (ASC-US): R87.610

## 2013-05-04 ENCOUNTER — Encounter: Payer: Self-pay | Admitting: Gynecology

## 2013-05-04 ENCOUNTER — Ambulatory Visit (INDEPENDENT_AMBULATORY_CARE_PROVIDER_SITE_OTHER): Payer: BC Managed Care – PPO | Admitting: Gynecology

## 2013-05-04 DIAGNOSIS — R87810 Cervical high risk human papillomavirus (HPV) DNA test positive: Secondary | ICD-10-CM

## 2013-05-04 DIAGNOSIS — R87610 Atypical squamous cells of undetermined significance on cytologic smear of cervix (ASC-US): Secondary | ICD-10-CM

## 2013-05-04 NOTE — Addendum Note (Signed)
Addended by: Dayna Barker on: 05/04/2013 10:17 AM   Modules accepted: Orders

## 2013-05-04 NOTE — Patient Instructions (Signed)
Office will call you with biopsy results 

## 2013-05-04 NOTE — Progress Notes (Signed)
Patient ID: Mackenzie Thomas, female   DOB: 15-Feb-1966, 47 y.o.   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 Does have a history a number of years ago at Dr. Lavon Paganini office of an abnormal Pap smear which ultimately she had cryosurgery. Normal Pap smears since until most recently.  Exam with Mackenzie Thomas External BUS vagina normal. Cervix normal. Uterus normal size midline mobile nontender. Adnexa without masses or tenderness.  Colposcopy after acetic acid cleanse adequate with area of acetowhite change 12:00 position transformation sound. Rep. biopsy taken. ECC performed. 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 Acetowhite change at 12:00, biopsy taken with ECC. Reviewed cervical dysplasia and positive HPV with the patient. Issues of progression/regression possible treatment options discussed. Patient will followup for biopsy results.

## 2013-05-08 ENCOUNTER — Ambulatory Visit (INDEPENDENT_AMBULATORY_CARE_PROVIDER_SITE_OTHER): Payer: BC Managed Care – PPO | Admitting: Gynecology

## 2013-05-08 ENCOUNTER — Encounter: Payer: Self-pay | Admitting: Gynecology

## 2013-05-08 DIAGNOSIS — N871 Moderate cervical dysplasia: Secondary | ICD-10-CM

## 2013-05-08 NOTE — Progress Notes (Signed)
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 She has a history of abnormal Pap smears years ago and cryosurgery by Dr. Elana Alm. 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  I reviewed the findings of moderate dysplasia and positive HPV with her and the options to include expectant management hoping for spontaneous resolution versus treatment options to include cryosurgery, LEEP, laser and cone biopsy.  She understands that treatment does not eradicate the HPV virus and that she is at risk for recurrences in the future until hopefully her body clears the virus. My recommendation would be to proceed with LEEP. I reviewed which involved with the procedure to include the risks of bleeding, infection, damage to vagina and surrounding tissues including bladder and rectum. The reproductive implications to include possible increased risks of miscarriage/preterm pregnancy loss or preterm delivery was also reviewed with her. Postoperative precautions reviewed. Possible pathology results to include dysplasia found with clear margins, 3 lesions and no pathology found. Pictures were drawn the patient is a clear understanding and wants to proceed with LEEP. She will schedule this at her convenience.

## 2013-05-08 NOTE — Patient Instructions (Signed)
Follow up for LEEP appointment

## 2013-05-14 DIAGNOSIS — IMO0002 Reserved for concepts with insufficient information to code with codable children: Secondary | ICD-10-CM

## 2013-05-14 HISTORY — DX: Reserved for concepts with insufficient information to code with codable children: IMO0002

## 2013-05-14 HISTORY — PX: CERVICAL BIOPSY  W/ LOOP ELECTRODE EXCISION: SUR135

## 2013-05-16 ENCOUNTER — Ambulatory Visit (INDEPENDENT_AMBULATORY_CARE_PROVIDER_SITE_OTHER): Payer: BC Managed Care – PPO | Admitting: Gynecology

## 2013-05-16 ENCOUNTER — Encounter: Payer: Self-pay | Admitting: Gynecology

## 2013-05-16 DIAGNOSIS — R87613 High grade squamous intraepithelial lesion on cytologic smear of cervix (HGSIL): Secondary | ICD-10-CM

## 2013-05-16 DIAGNOSIS — IMO0002 Reserved for concepts with insufficient information to code with codable children: Secondary | ICD-10-CM

## 2013-05-16 NOTE — Patient Instructions (Signed)
Loop Electrosurgical Excision Procedure  Loop electrosurgical excision procedure (LEEP) is the removal of a portion of the lower part of the uterus (cervix). The procedure is done when there are significantly abnormal cervical cell changes. Abnormal cell changes of the cervix can lead to cancer if left in place and untreated.     The LEEP procedure itself typically only takes a few minutes. Often, it may be done in your caregiver's office. The procedure is considered safe for those who wish to get pregnant or are trying to get pregnant. Only under rare circumstances should this procedure be done if you are pregnant.  LET YOUR CAREGIVER KNOW ABOUT:  · Whether you are pregnant or late for your last menstrual period.  · Allergies to foods or medicines.  · All the medicines you are taking including herbs, eyedrops, and over-the-counter medicines, and creams.  · Use of steroids (by mouth or creams).  · Previous problems with anesthetics or numbing medicine.  · Previous gynecological surgery.  · History of blood clots or bleeding problems.  · Any recent or current vaginal infections (herpes, sexually transmitted infections).  · Other health problems.  RISKS AND COMPLICATIONS  · Bleeding.  · Infection.  · Injury to the vagina, bladder, or rectum.  · Very rare obstruction of the cervical opening that causes problems during menstruation (cervical stenosis).  BEFORE THE PROCEDURE  · Do not take aspirin or blood thinners (anticoagulants) for 1 week before the procedure, or as told by your caregiver.  · Eat a light meal before the procedure.  · Ask your caregiver about changing or stopping your regular medicines.  · You may be given a pain reliever 1 or 2 hours before the procedure.  PROCEDURE   · A tool (speculum) is placed in the vagina. This allows your caregiver to see the cervix.  · An iodine stain is applied to the cervix to find the area of abnormal cells to be removed.  · Medicine is injected to numb the cervix (local  anesthetic).    · Electricity is passed through a thin wire loop which is then used to remove (cauterize) a small segment of the affected cervix.  · Light electrocautery is used to seal any small blood vessels and prevent bleeding.  · A paste may be applied to the cauterized area of the cervix to help prevent bleeding.  · The tissue sample is sent to the lab. It is examined under the microscope.  AFTER THE PROCEDURE  · Have someone drive you home.  · You may have slight to moderate cramping.  · You may notice a black vaginal discharge from the paste used on the cervix to prevent bleeding. This is normal.  · Watch for excessive bleeding. This requires immediate medical care.  · Ask when your test results will be ready. Make sure you get your test results.  Document Released: 11/20/2002 Document Revised: 11/22/2011 Document Reviewed: 02/09/2011  Exit[employer]® Patient Information ©2014 [employer].

## 2013-05-16 NOTE — Progress Notes (Signed)
The patient and I reviewed her indications for LEEP:  HGSIL on colposcopic biopsy and ECC.  I have reviewed the procedure with her as outlined in my 05/08/2013 note. Patient understands and accepts without questions.   Procedure:   A pelvic exam was performed and was normal.   The cervix was visualized with a LEEP speculum cleansed with acetic acid and re-colposcopy performed with the transformation zone easily identified. The cervix was circumferentially injected with 2% lidocaine solution with epinephrine 1 / 100,000 dilution, a total of 6 cc's were used. The LEEP generator was set at 70 W cutting 60 W coagulation blend one current. Using the 20x12 mm LEEP wand, the LEEP specimen was excised in a single pass. An ECC was performed afterwards.  Ball coagulation was delivered to the LEEP base to achieve hemostasis and prophylactic Monsel's solution was applied. The specimen was cut open at 3 o'clock and pinned open on the cork and sent to pathology along with the ECC. The patient tolerated the procedure well and postoperative instructions were discussed with her. Patient knows to follow up for pathology results in several days and then we will discuss the long-term plan as far as follow up screening.

## 2013-05-18 ENCOUNTER — Telehealth: Payer: Self-pay | Admitting: Gynecology

## 2013-05-18 NOTE — Telephone Encounter (Signed)
I CALLED PT AND SCHEDULED COLPOSCOPY APPT FOR Aug 17, 2013 PER DR FONTAINE.

## 2013-05-18 NOTE — Telephone Encounter (Signed)
I called the patient with her pathology results from the LEEP. It showed HGSIL with clear ectocervical margins but involved endocervical margins with a positive ECC. I reviewed the possibilities to include possible followup deeper, narrower LEEP. I also reviewed the possibility of spontaneous regression. Recommended colposcopy at 3-4 months and we'll reevaluate after healing with followup ECC. Importance of followup was stressed. Patient will make that appointment.

## 2013-05-31 ENCOUNTER — Telehealth: Payer: Self-pay | Admitting: *Deleted

## 2013-05-31 NOTE — Telephone Encounter (Signed)
Pt informed with the below note. 

## 2013-05-31 NOTE — Telephone Encounter (Signed)
Yes

## 2013-05-31 NOTE — Telephone Encounter (Signed)
Pt had Leep done on 05/16/13 pt asked okay to wear tampon now?

## 2013-06-13 ENCOUNTER — Other Ambulatory Visit: Payer: Self-pay | Admitting: Women's Health

## 2013-06-13 NOTE — Telephone Encounter (Signed)
Called into pharmacy

## 2013-07-02 ENCOUNTER — Telehealth: Payer: Self-pay | Admitting: *Deleted

## 2013-07-02 NOTE — Telephone Encounter (Signed)
Pt aware you are out of the office, pt said at annual you gave her samples of lo Loestrin? Pt is requesting Rx for this now, I didn't see in office note. Okay to send Rx? Please advise

## 2013-07-03 MED ORDER — NORETHINDRONE ACET-ETHINYL EST 1-20 MG-MCG PO TABS: 1.0000 | ORAL_TABLET | Freq: Every day | ORAL | Status: DC

## 2013-07-03 NOTE — Telephone Encounter (Signed)
Pt informed, pt will like to have generic Rx.Loestrin 1/20 sent to pharmacy.

## 2013-07-03 NOTE — Telephone Encounter (Signed)
Okay, review lo Loestrin is non-generic, Loestrin is a low estrogen products. Loestrin 1/20 prescription ok also if she wants a generic, take one daily, with refills. She had been on Yaz and wanted to try a different products. Was contemplating BTL also.

## 2013-07-23 ENCOUNTER — Other Ambulatory Visit: Payer: Self-pay | Admitting: Women's Health

## 2013-07-24 NOTE — Telephone Encounter (Signed)
rx called in [facility] 

## 2013-08-17 ENCOUNTER — Ambulatory Visit (INDEPENDENT_AMBULATORY_CARE_PROVIDER_SITE_OTHER): Payer: BC Managed Care – PPO | Admitting: Gynecology

## 2013-08-17 ENCOUNTER — Other Ambulatory Visit (HOSPITAL_COMMUNITY)
Admission: RE | Admit: 2013-08-17 | Discharge: 2013-08-17 | Disposition: A | Discharge status: Home / Self Care | Payer: BC Managed Care – PPO | Source: Ambulatory Visit | Attending: Gynecology | Admitting: Gynecology

## 2013-08-17 ENCOUNTER — Encounter: Payer: Self-pay | Admitting: Gynecology

## 2013-08-17 DIAGNOSIS — Z1151 Encounter for screening for human papillomavirus (HPV): Secondary | ICD-10-CM | POA: Insufficient documentation

## 2013-08-17 DIAGNOSIS — R87613 High grade squamous intraepithelial lesion on cytologic smear of cervix (HGSIL): Secondary | ICD-10-CM

## 2013-08-17 DIAGNOSIS — Z01419 Encounter for gynecological examination (general) (routine) without abnormal findings: Secondary | ICD-10-CM | POA: Insufficient documentation

## 2013-08-17 DIAGNOSIS — IMO0002 Reserved for concepts with insufficient information to code with codable children: Secondary | ICD-10-CM

## 2013-08-17 NOTE — Patient Instructions (Signed)
Office will call you with the biopsy results 

## 2013-08-17 NOTE — Progress Notes (Signed)
Patient ID: Mackenzie Thomas, female   DOB: Jul 26, 1966, 47 y.o.   MRN: [id number] She presents for followup colposcopy. History of LEEP 05/2013 for HGSIL with the following pathology:  Diagnosis 1. 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 - CAUTERIZED HIGH GRADE DYSPLASIA PRESENT AT ENDOCERVICAL MARGIN. - ECTOCERVICAL MARGIN, NEGATIVE FOR DYSPLASIA. 2. Endocervix, curettage - DETACHED FRAGMENTS OF DYSPLASTIC SQUAMOUS EPITHELIUM SEE COMMENT - BENIGN ENDOCERVICAL MUCOSA  Due to the involved margins with HGSIL and positive ECC I asked her to come back at a 2 to three-month interval for early colposcopy.  Exam with Berenice Bouton External BUS vagina normal. Cervix normal with stenotic appearing os. Uterus normal size midline mobile nontender. Adnexa without masses or tenderness.  Colposcopy after acetic acid cleanse is inadequate. No abnormalities seen. ECC and Pap smear done after dilatation of the cervical os. Physical Exam  Genitourinary:     Assessment and plan: High-grade dysplasia status post LEEP with involved margins and positive ECC. Colposcopy is inadequate but normal. Regular Pap smear with followup ECC performed. Patient will followup for results.

## 2013-08-17 NOTE — Addendum Note (Signed)
Addended by: Dayna Barker on: 08/17/2013 09:42 AM   Modules accepted: Orders

## 2013-08-23 ENCOUNTER — Telehealth: Payer: Self-pay | Admitting: *Deleted

## 2013-08-23 NOTE — Telephone Encounter (Signed)
Office visit to discuss?

## 2013-08-23 NOTE — Telephone Encounter (Signed)
Message left

## 2013-08-23 NOTE — Telephone Encounter (Signed)
Pt called c/o night sweats, mood swings x several months now, no longer on birth control pill stopped taking this month. Pt asked if she could be starting menopause? Ov to discuss? Come in for labs? Please advise

## 2013-08-24 ENCOUNTER — Encounter: Payer: Self-pay | Admitting: Gynecology

## 2013-08-24 NOTE — Telephone Encounter (Signed)
Pt informed with the below. 

## 2013-08-28 ENCOUNTER — Ambulatory Visit: Payer: BC Managed Care – PPO | Admitting: Women's Health

## 2013-09-18 ENCOUNTER — Ambulatory Visit: Payer: BC Managed Care – PPO | Admitting: Women's Health

## 2013-09-25 ENCOUNTER — Encounter: Payer: Self-pay | Admitting: Women's Health

## 2013-09-25 ENCOUNTER — Ambulatory Visit (INDEPENDENT_AMBULATORY_CARE_PROVIDER_SITE_OTHER): Payer: BC Managed Care – PPO | Admitting: Women's Health

## 2013-09-25 DIAGNOSIS — Z780 Asymptomatic menopausal state: Secondary | ICD-10-CM

## 2013-09-25 DIAGNOSIS — N951 Menopausal and female climacteric states: Secondary | ICD-10-CM

## 2013-09-25 LAB — PREGNANCY, URINE: Preg Test, Ur: NEGATIVE

## 2013-09-25 MED ORDER — ESTRADIOL 1 MG PO TABS: 1.0000 mg | ORAL_TABLET | Freq: Every day | ORAL | Status: DC

## 2013-09-25 MED ORDER — PROGESTERONE MICRONIZED 200 MG PO CAPS: ORAL_CAPSULE | ORAL | Status: DC

## 2013-09-25 NOTE — Progress Notes (Signed)
Patient ID: Mackenzie FriskStacey B Thomas, female   DOB: 1966-07-02, 48 y.o.   MRN: [id number] Presents with increased hot flushes, poor sleep, increased irritability, abdominal weight gain. Amenorrheic x3 months. Withdrawal for contraception.  Exam: Appears well but warm.  Perimenopausal with hot flushes  Plan: FSH, U PT. Options reviewed, Estrace 1 mg by mouth daily, Prometrium 200 mg at HS day one through 12 of each month, reviewed risks of blood clots, strokes, breast cancer. Reviewed may get a withdrawal bleed. Instructed to call if no relief of symptoms. Instructed to continue withdrawal or condoms.

## 2013-09-25 NOTE — Patient Instructions (Signed)

## 2013-09-26 LAB — FOLLICLE STIMULATING HORMONE: FSH: 28.3 m[IU]/mL

## 2014-02-18 ENCOUNTER — Other Ambulatory Visit: Payer: Self-pay

## 2014-02-19 MED ORDER — ALPRAZOLAM 0.5 MG PO TABS: ORAL_TABLET | ORAL | Status: DC

## 2014-02-19 NOTE — Telephone Encounter (Signed)
Called into pharmacy

## 2014-03-29 ENCOUNTER — Encounter: Payer: BC Managed Care – PPO | Admitting: Women's Health

## 2014-04-24 ENCOUNTER — Encounter: Payer: BC Managed Care – PPO | Admitting: Women's Health

## 2014-05-20 ENCOUNTER — Other Ambulatory Visit: Payer: Self-pay | Admitting: Women's Health

## 2014-05-21 NOTE — Telephone Encounter (Signed)
Ok to refill, please have her sched annual.

## 2014-05-22 NOTE — Telephone Encounter (Signed)
rx called in [facility] 

## 2014-05-22 NOTE — Telephone Encounter (Signed)
Called in [facility] 

## 2014-06-04 ENCOUNTER — Encounter: Payer: BC Managed Care – PPO | Admitting: Women's Health

## 2014-06-17 ENCOUNTER — Other Ambulatory Visit: Payer: Self-pay

## 2014-06-17 DIAGNOSIS — F419 Anxiety disorder, unspecified: Secondary | ICD-10-CM

## 2014-06-17 MED ORDER — CITALOPRAM HYDROBROMIDE 40 MG PO TABS: 40.0000 mg | ORAL_TABLET | Freq: Every day | ORAL | Status: DC

## 2014-06-17 NOTE — Telephone Encounter (Signed)
Please call and review need to sched annual exam (cancelled last one)  Ok to refill for 1 month

## 2014-06-17 NOTE — Telephone Encounter (Signed)
Phoned in to pharmacy. 

## 2014-06-19 ENCOUNTER — Encounter: Payer: Self-pay | Admitting: Women's Health

## 2014-06-19 ENCOUNTER — Other Ambulatory Visit (HOSPITAL_COMMUNITY)
Admission: RE | Admit: 2014-06-19 | Discharge: 2014-06-19 | Disposition: A | Discharge status: Home / Self Care | Payer: BC Managed Care – PPO | Source: Ambulatory Visit | Attending: Women's Health | Admitting: Women's Health

## 2014-06-19 ENCOUNTER — Ambulatory Visit (INDEPENDENT_AMBULATORY_CARE_PROVIDER_SITE_OTHER): Payer: BC Managed Care – PPO | Admitting: Women's Health

## 2014-06-19 VITALS — BP 110/78 | Ht 64.0 in | Wt 140.0 lb

## 2014-06-19 DIAGNOSIS — Z1151 Encounter for screening for human papillomavirus (HPV): Secondary | ICD-10-CM | POA: Insufficient documentation

## 2014-06-19 DIAGNOSIS — Z124 Encounter for screening for malignant neoplasm of cervix: Secondary | ICD-10-CM

## 2014-06-19 DIAGNOSIS — Z01419 Encounter for gynecological examination (general) (routine) without abnormal findings: Secondary | ICD-10-CM | POA: Insufficient documentation

## 2014-06-19 DIAGNOSIS — R87810 Cervical high risk human papillomavirus (HPV) DNA test positive: Secondary | ICD-10-CM

## 2014-06-19 DIAGNOSIS — Z13220 Encounter for screening for lipoid disorders: Secondary | ICD-10-CM

## 2014-06-19 DIAGNOSIS — F419 Anxiety disorder, unspecified: Secondary | ICD-10-CM

## 2014-06-19 DIAGNOSIS — R87610 Atypical squamous cells of undetermined significance on cytologic smear of cervix (ASC-US): Secondary | ICD-10-CM

## 2014-06-19 LAB — CBC WITH DIFFERENTIAL/PLATELET
BASOS PCT: 0 % (ref 0–1)
Basophils Absolute: 0 10*3/uL (ref 0.0–0.1)
Eosinophils Absolute: 0.1 10*3/uL (ref 0.0–0.7)
Eosinophils Relative: 2 % (ref 0–5)
HEMATOCRIT: 39.7 % (ref 36.0–46.0)
Hemoglobin: 13.7 g/dL (ref 12.0–15.0)
Lymphocytes Relative: 24 % (ref 12–46)
Lymphs Abs: 1.6 10*3/uL (ref 0.7–4.0)
MCH: 32.2 pg (ref 26.0–34.0)
MCHC: 34.5 g/dL (ref 30.0–36.0)
MCV: 93.2 fL (ref 78.0–100.0)
MONO ABS: 0.4 10*3/uL (ref 0.1–1.0)
MONOS PCT: 6 % (ref 3–12)
NEUTROS ABS: 4.6 10*3/uL (ref 1.7–7.7)
Neutrophils Relative %: 68 % (ref 43–77)
Platelets: 251 10*3/uL (ref 150–400)
RBC: 4.26 MIL/uL (ref 3.87–5.11)
RDW: 13.3 % (ref 11.5–15.5)
WBC: 6.7 10*3/uL (ref 4.0–10.5)

## 2014-06-19 LAB — LIPID PANEL
CHOL/HDL RATIO: 2.4 ratio
Cholesterol: 167 mg/dL (ref 0–200)
HDL: 69 mg/dL (ref >39)
LDL CALC: 84 mg/dL (ref 0–99)
TRIGLYCERIDES: 69 mg/dL (ref <150)
VLDL: 14 mg/dL (ref 0–40)

## 2014-06-19 LAB — GLUCOSE, RANDOM: GLUCOSE: 84 mg/dL (ref 70–99)

## 2014-06-19 MED ORDER — CITALOPRAM HYDROBROMIDE 40 MG PO TABS: 40.0000 mg | ORAL_TABLET | Freq: Every day | ORAL | Status: DC

## 2014-06-19 MED ORDER — ZOLPIDEM TARTRATE 10 MG PO TABS: ORAL_TABLET | ORAL | Status: DC

## 2014-06-19 MED ORDER — ALPRAZOLAM 0.5 MG PO TABS: ORAL_TABLET | ORAL | Status: DC

## 2014-06-19 NOTE — Progress Notes (Signed)
Mackenzie Thomas Dec 02, 1965 [id number]    History:    Presents for annual exam.  [facility] Normal [facility]ogram history. 1998 cryo- with normal Paps after clear 05/2013 HGSIL with positive endocervical margins negative ectocervical margins. Anxiety and depression stable on Celexa 40 mg daily. Same partner.  Past medical history, past surgical history, family history and social history were all reviewed and documented in the EPIC chart. Works for an [employer] company. Mackenzie Thomas 15 type I diabetic, Mackenzie Thomas 12 both doing well.  ROS:  A  12 point ROS was performed and pertinent positives and negatives are included.  Exam:  Filed Vitals:   06/19/14 0945  BP: 110/78    General appearance:  Normal Thyroid:  Symmetrical, normal in size, without palpable masses or nodularity. Respiratory  Auscultation:  Clear without wheezing or rhonchi Cardiovascular  Auscultation:  Regular rate, without rubs, murmurs or gallops  Edema/varicosities:  Not grossly evident Abdominal  Soft,nontender, without masses, guarding or rebound.  Liver/spleen:  No organomegaly noted  Hernia:  None appreciated  Skin  Inspection:  Grossly normal   Breasts: Examined lying and sitting.     Right: Without masses, retractions, discharge or axillary adenopathy.     Left: Without masses, retractions, discharge or axillary adenopathy. Gentitourinary   Inguinal/mons:  Normal without inguinal adenopathy  External genitalia:  Normal  BUS/Urethra/Skene's glands:  Normal  Vagina:  Normal  Cervix:  Normal  Uterus:   normal in size, shape and contour.  Midline and mobile  Adnexa/parametria:     Rt: Without masses or tenderness.   Lt: Without masses or tenderness.  Anus and perineum: Normal  Digital rectal exam: Normal sphincter tone without palpated masses or tenderness  Assessment/Plan:  48 y.o. DWF G2P2 for annual exam.   Perimenopausal  05/2013 LEEP HGSIL positive endocervical, negative ectocervical  margins Anxiety/depression stable on Celexa  Plan: Menopause reviewed, instructed to call if any further bleeding. SBE's, annual mammogram,3D tomography reviewed and encouraged history of dense breast. Calcium rich diet, vitamin D 1000 daily and regular exercise encouraged. Celexa 40 mg by mouth daily prescription, proper use given and reviewed. Uses a  Xanax sparingly, for flying, occasional Ambien. Aware of addictive properties of both. CBC, glucose, lipid panel, UA, Pap with HR HPV typing.   Mackenzie ChallengerYOUNG,NANCY Thomas WHNP, 12:09 PM 06/19/2014

## 2014-06-19 NOTE — Patient Instructions (Signed)

## 2014-06-20 LAB — URINALYSIS W MICROSCOPIC + REFLEX CULTURE
BACTERIA UA: NONE SEEN
Bilirubin Urine: NEGATIVE
CRYSTALS: NONE SEEN
Casts: NONE SEEN
Glucose, UA: NEGATIVE mg/dL
Hgb urine dipstick: NEGATIVE
KETONES UR: NEGATIVE mg/dL
Leukocytes, UA: NEGATIVE
NITRITE: NEGATIVE
PROTEIN: NEGATIVE mg/dL
Specific Gravity, Urine: 1.012 (ref 1.005–1.030)
UROBILINOGEN UA: 0.2 mg/dL (ref 0.0–1.0)
pH: 6 (ref 5.0–8.0)

## 2014-06-21 LAB — CYTOLOGY - PAP

## 2014-07-15 ENCOUNTER — Encounter: Payer: Self-pay | Admitting: Women's Health

## 2014-07-16 ENCOUNTER — Other Ambulatory Visit: Payer: Self-pay

## 2014-07-16 DIAGNOSIS — F419 Anxiety disorder, unspecified: Secondary | ICD-10-CM

## 2014-07-16 MED ORDER — CITALOPRAM HYDROBROMIDE 40 MG PO TABS: 40.0000 mg | ORAL_TABLET | Freq: Every day | ORAL | Status: DC

## 2014-08-27 ENCOUNTER — Encounter: Payer: Self-pay | Admitting: Women's Health

## 2014-09-16 ENCOUNTER — Other Ambulatory Visit: Payer: Self-pay | Admitting: Dermatology

## 2014-11-19 ENCOUNTER — Ambulatory Visit: Payer: Self-pay | Admitting: Women's Health

## 2014-11-20 ENCOUNTER — Ambulatory Visit: Payer: Self-pay | Admitting: Women's Health

## 2015-05-08 ENCOUNTER — Other Ambulatory Visit: Payer: Self-pay

## 2015-05-08 DIAGNOSIS — F419 Anxiety disorder, unspecified: Secondary | ICD-10-CM

## 2015-05-08 MED ORDER — ALPRAZOLAM 0.5 MG PO TABS: ORAL_TABLET | ORAL | Status: DC

## 2015-05-09 ENCOUNTER — Other Ambulatory Visit: Payer: Self-pay

## 2015-05-09 NOTE — Telephone Encounter (Signed)
Rx called in to pharmacy. 

## 2015-07-17 ENCOUNTER — Encounter: Payer: Self-pay | Admitting: Women's Health

## 2015-07-31 ENCOUNTER — Encounter: Payer: Self-pay | Admitting: Women's Health

## 2015-08-11 ENCOUNTER — Other Ambulatory Visit: Payer: Self-pay | Admitting: Women's Health

## 2015-08-14 ENCOUNTER — Encounter: Payer: Self-pay | Admitting: Women's Health

## 2015-09-15 ENCOUNTER — Encounter: Payer: Self-pay | Admitting: Women's Health

## 2015-10-02 ENCOUNTER — Other Ambulatory Visit: Payer: Self-pay | Admitting: Women's Health

## 2015-10-07 ENCOUNTER — Other Ambulatory Visit: Payer: Self-pay | Admitting: *Deleted

## 2015-10-07 MED ORDER — CITALOPRAM HYDROBROMIDE 40 MG PO TABS: 40.0000 mg | ORAL_TABLET | Freq: Every day | ORAL | Status: DC

## 2015-10-07 NOTE — Telephone Encounter (Signed)
Okay for refill, has canceled annual exam appointments several times, remind important to keep scheduled appointment

## 2015-10-07 NOTE — Telephone Encounter (Signed)
Rx sent, donna will relay the below to pt.

## 2015-10-07 NOTE — Telephone Encounter (Signed)
Pt called requesting refill on Celexa 40 mg tablets, has annual scheduled on 10/17/15, Celexa last filled on 08/11/15. Please advise

## 2015-10-10 ENCOUNTER — Ambulatory Visit (INDEPENDENT_AMBULATORY_CARE_PROVIDER_SITE_OTHER): Payer: 59 | Admitting: Women's Health

## 2015-10-10 ENCOUNTER — Encounter: Payer: Self-pay | Admitting: Women's Health

## 2015-10-10 ENCOUNTER — Other Ambulatory Visit (HOSPITAL_COMMUNITY)
Admission: RE | Admit: 2015-10-10 | Discharge: 2015-10-10 | Disposition: A | Discharge status: Home / Self Care | Payer: 59 | Source: Ambulatory Visit | Attending: Gynecology | Admitting: Gynecology

## 2015-10-10 VITALS — BP 120/79 | Ht 64.0 in | Wt 146.4 lb

## 2015-10-10 DIAGNOSIS — F419 Anxiety disorder, unspecified: Secondary | ICD-10-CM

## 2015-10-10 DIAGNOSIS — G4700 Insomnia, unspecified: Secondary | ICD-10-CM

## 2015-10-10 DIAGNOSIS — N912 Amenorrhea, unspecified: Secondary | ICD-10-CM

## 2015-10-10 DIAGNOSIS — Z01419 Encounter for gynecological examination (general) (routine) without abnormal findings: Secondary | ICD-10-CM | POA: Diagnosis not present

## 2015-10-10 LAB — COMPREHENSIVE METABOLIC PANEL
ALK PHOS: 41 U/L (ref 33–115)
ALT: 12 U/L (ref 6–29)
AST: 15 U/L (ref 10–35)
Albumin: 3.8 g/dL (ref 3.6–5.1)
BUN: 10 mg/dL (ref 7–25)
CO2: 27 mmol/L (ref 20–31)
CREATININE: 0.74 mg/dL (ref 0.50–1.10)
Calcium: 8.7 mg/dL (ref 8.6–10.2)
Chloride: 103 mmol/L (ref 98–110)
GLUCOSE: 83 mg/dL (ref 65–99)
Potassium: 3.9 mmol/L (ref 3.5–5.3)
SODIUM: 135 mmol/L (ref 135–146)
TOTAL PROTEIN: 6.9 g/dL (ref 6.1–8.1)
Total Bilirubin: 0.4 mg/dL (ref 0.2–1.2)

## 2015-10-10 LAB — CBC WITH DIFFERENTIAL/PLATELET
BASOS PCT: 0 % (ref 0–1)
Basophils Absolute: 0 10*3/uL (ref 0.0–0.1)
EOS ABS: 0.1 10*3/uL (ref 0.0–0.7)
EOS PCT: 1 % (ref 0–5)
HCT: 37.6 % (ref 36.0–46.0)
Hemoglobin: 12.6 g/dL (ref 12.0–15.0)
LYMPHS ABS: 1.5 10*3/uL (ref 0.7–4.0)
Lymphocytes Relative: 22 % (ref 12–46)
MCH: 31.3 pg (ref 26.0–34.0)
MCHC: 33.5 g/dL (ref 30.0–36.0)
MCV: 93.3 fL (ref 78.0–100.0)
MONOS PCT: 6 % (ref 3–12)
MPV: 9.6 fL (ref 8.6–12.4)
Monocytes Absolute: 0.4 10*3/uL (ref 0.1–1.0)
Neutro Abs: 5 10*3/uL (ref 1.7–7.7)
Neutrophils Relative %: 71 % (ref 43–77)
PLATELETS: 238 10*3/uL (ref 150–400)
RBC: 4.03 MIL/uL (ref 3.87–5.11)
RDW: 12.7 % (ref 11.5–15.5)
WBC: 7 10*3/uL (ref 4.0–10.5)

## 2015-10-10 LAB — TSH: TSH: 0.417 u[IU]/mL (ref 0.350–4.500)

## 2015-10-10 MED ORDER — ALPRAZOLAM 0.5 MG PO TABS: ORAL_TABLET | ORAL | Status: DC

## 2015-10-10 MED ORDER — CITALOPRAM HYDROBROMIDE 40 MG PO TABS: 40.0000 mg | ORAL_TABLET | Freq: Every day | ORAL | Status: DC

## 2015-10-10 MED ORDER — ZOLPIDEM TARTRATE 10 MG PO TABS: ORAL_TABLET | ORAL | Status: DC

## 2015-10-10 NOTE — Progress Notes (Signed)
Mackenzie Thomas [birth year] [id number]    History:    Presents for annual exam.  Irregular periods the past 2 years, one light cycle 2 months ago. 1998 cryo-with normal Paps until 2014 HGSIL, 05/2013 LEEP positive endocervical and negative ectocervical margins. Pap 06/2014 normal with negative HR HPV. Anxiety and depression stable on Celexa 40, rare Xanax and Ambien use. Same partner years. Normal mammograms.  Past medical history, past surgical history, family history and social history were all reviewed and documented in the EPIC chart. 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 Mackenzie Thomas type 1 diabetes doing well, Mackenzie Thomas doing well.  ROS:  A ROS was performed and pertinent positives and negatives are included.  Exam:  Filed Vitals:   10/10/15 1437  BP: 120/79    General appearance:  Normal Thyroid:  Symmetrical, normal in size, without palpable masses or nodularity. Respiratory  Auscultation:  Clear without wheezing or rhonchi Cardiovascular  Auscultation:  Regular rate, without rubs, murmurs or gallops  Edema/varicosities:  Not grossly evident Abdominal  Soft,nontender, without masses, guarding or rebound.  Liver/spleen:  No organomegaly noted  Hernia:  None appreciated  Skin  Inspection:  Grossly normal   Breasts: Examined lying and sitting.     Right: Without masses, retractions, discharge or axillary adenopathy.     Left: Without masses, retractions, discharge or axillary adenopathy. Gentitourinary   Inguinal/mons:  Normal without inguinal adenopathy  External genitalia:  Normal  BUS/Urethra/Skene's glands:  Normal  Vagina: Mild atrophy  Cervix:  Normal  Uterus:  normal in size, shape and contour.  Midline and mobile  Adnexa/parametria:     Rt: Without masses or tenderness.   Lt: Without masses or tenderness.  Anus and perineum: Normal  Digital rectal exam: Normal sphincter tone without palpated masses or tenderness  Assessment/Plan:  50 y.o. DW F G2 P2 for annual exam.     Perimenopausal 2014 LEEP with positive endocervical, negative ectocervical margins, HGSIL Anxiety and depression stable on Celexa  Plan: CBC, FSH, CMP, TSH, UA, Pap. SBE's, continue annual screening 3-D mammogram has scheduled. Regular exercise, calcium rich diet, encourage. Celexa 40 mg by mouth daily prescription, proper use given and reviewed has had counseling in the past denies need at this time. Uses an occasional Xanax 0.5 rare use aware of addictive properties prescription given. Ambien 10 mg at bedtime when necessary #30. Reviewed importance of using sparingly sleep hygiene reviewed. Menopause reviewed, occasional hot flushes.   Harrington Challenger [facility], 3:04 PM 10/10/2015

## 2015-10-10 NOTE — Patient Instructions (Signed)

## 2015-10-11 LAB — FOLLICLE STIMULATING HORMONE: FSH: 4.9 m[IU]/mL

## 2015-10-13 ENCOUNTER — Other Ambulatory Visit: Payer: Self-pay | Admitting: Women's Health

## 2015-10-13 DIAGNOSIS — Z01419 Encounter for gynecological examination (general) (routine) without abnormal findings: Secondary | ICD-10-CM

## 2015-10-14 ENCOUNTER — Encounter: Payer: Self-pay | Admitting: Women's Health

## 2015-10-15 LAB — CYTOLOGY - PAP

## 2015-10-17 ENCOUNTER — Other Ambulatory Visit: Payer: Self-pay

## 2015-10-17 ENCOUNTER — Encounter: Payer: Self-pay | Admitting: Women's Health

## 2015-10-21 ENCOUNTER — Other Ambulatory Visit: Payer: 59

## 2016-04-14 ENCOUNTER — Encounter (INDEPENDENT_AMBULATORY_CARE_PROVIDER_SITE_OTHER): Payer: Self-pay

## 2016-04-22 ENCOUNTER — Other Ambulatory Visit: Payer: Self-pay

## 2016-04-22 DIAGNOSIS — F419 Anxiety disorder, unspecified: Secondary | ICD-10-CM

## 2016-04-26 MED ORDER — ALPRAZOLAM 0.5 MG PO TABS: ORAL_TABLET | ORAL | 1 refills | Status: DC

## 2016-04-26 NOTE — Telephone Encounter (Signed)
Called into pharmacy

## 2016-04-26 NOTE — Telephone Encounter (Signed)
Ok for refill? 

## 2016-06-21 ENCOUNTER — Other Ambulatory Visit: Payer: Self-pay

## 2016-06-21 MED ORDER — ZOLPIDEM TARTRATE 10 MG PO TABS: ORAL_TABLET | ORAL | 1 refills | Status: DC

## 2016-06-21 NOTE — Telephone Encounter (Signed)
Called into pharmacy

## 2016-10-07 ENCOUNTER — Other Ambulatory Visit: Payer: Self-pay | Admitting: Women's Health

## 2016-10-07 DIAGNOSIS — F419 Anxiety disorder, unspecified: Secondary | ICD-10-CM

## 2016-10-07 NOTE — Telephone Encounter (Signed)
Called into pharmacy

## 2016-10-07 NOTE — Telephone Encounter (Signed)
Ok for refill? 

## 2016-10-12 ENCOUNTER — Ambulatory Visit (INDEPENDENT_AMBULATORY_CARE_PROVIDER_SITE_OTHER): Payer: 59 | Admitting: Women's Health

## 2016-10-12 ENCOUNTER — Encounter: Payer: Self-pay | Admitting: Women's Health

## 2016-10-12 VITALS — BP 120/78 | Ht 64.0 in | Wt 147.0 lb

## 2016-10-12 DIAGNOSIS — F5101 Primary insomnia: Secondary | ICD-10-CM

## 2016-10-12 DIAGNOSIS — Z13220 Encounter for screening for lipoid disorders: Secondary | ICD-10-CM | POA: Diagnosis not present

## 2016-10-12 DIAGNOSIS — F419 Anxiety disorder, unspecified: Secondary | ICD-10-CM

## 2016-10-12 DIAGNOSIS — Z1151 Encounter for screening for human papillomavirus (HPV): Secondary | ICD-10-CM

## 2016-10-12 DIAGNOSIS — Z01419 Encounter for gynecological examination (general) (routine) without abnormal findings: Secondary | ICD-10-CM

## 2016-10-12 LAB — LIPID PANEL
Cholesterol: 172 mg/dL (ref <200)
HDL: 73 mg/dL (ref >50)
LDL CALC: 77 mg/dL (ref <100)
TRIGLYCERIDES: 109 mg/dL (ref <150)
Total CHOL/HDL Ratio: 2.4 Ratio (ref <5.0)
VLDL: 22 mg/dL (ref <30)

## 2016-10-12 LAB — CBC WITH DIFFERENTIAL/PLATELET
BASOS ABS: 0 {cells}/uL (ref 0–200)
Basophils Relative: 0 %
EOS ABS: 106 {cells}/uL (ref 15–500)
Eosinophils Relative: 2 %
HCT: 40 % (ref 35.0–45.0)
Hemoglobin: 13.3 g/dL (ref 11.7–15.5)
LYMPHS PCT: 30 %
Lymphs Abs: 1590 cells/uL (ref 850–3900)
MCH: 31.1 pg (ref 27.0–33.0)
MCHC: 33.3 g/dL (ref 32.0–36.0)
MCV: 93.7 fL (ref 80.0–100.0)
MONO ABS: 371 {cells}/uL (ref 200–950)
MONOS PCT: 7 %
MPV: 9.6 fL (ref 7.5–12.5)
Neutro Abs: 3233 cells/uL (ref 1500–7800)
Neutrophils Relative %: 61 %
PLATELETS: 246 10*3/uL (ref 140–400)
RBC: 4.27 MIL/uL (ref 3.80–5.10)
RDW: 13.5 % (ref 11.0–15.0)
WBC: 5.3 10*3/uL (ref 3.8–10.8)

## 2016-10-12 LAB — COMPREHENSIVE METABOLIC PANEL
ALBUMIN: 4.1 g/dL (ref 3.6–5.1)
ALT: 18 U/L (ref 6–29)
AST: 23 U/L (ref 10–35)
Alkaline Phosphatase: 51 U/L (ref 33–115)
BUN: 13 mg/dL (ref 7–25)
CHLORIDE: 103 mmol/L (ref 98–110)
CO2: 25 mmol/L (ref 20–31)
CREATININE: 0.84 mg/dL (ref 0.50–1.10)
Calcium: 9.8 mg/dL (ref 8.6–10.2)
GLUCOSE: 76 mg/dL (ref 65–99)
Potassium: 4.2 mmol/L (ref 3.5–5.3)
SODIUM: 139 mmol/L (ref 135–146)
Total Bilirubin: 0.4 mg/dL (ref 0.2–1.2)
Total Protein: 7.9 g/dL (ref 6.1–8.1)

## 2016-10-12 MED ORDER — ZOLPIDEM TARTRATE 10 MG PO TABS: ORAL_TABLET | ORAL | 1 refills | Status: DC

## 2016-10-12 MED ORDER — CITALOPRAM HYDROBROMIDE 40 MG PO TABS: 40.0000 mg | ORAL_TABLET | Freq: Every day | ORAL | 4 refills | Status: DC

## 2016-10-12 MED ORDER — ALPRAZOLAM 0.5 MG PO TABS: 0.5000 mg | ORAL_TABLET | Freq: Every day | ORAL | 1 refills | Status: DC | PRN

## 2016-10-12 NOTE — Patient Instructions (Signed)

## 2016-10-12 NOTE — Progress Notes (Signed)
Edgar FriskStacey B Naval 1965/12/05 [id number]    History:    Presents for annual exam.  Cycles becoming more irregular 3-4 in the past year lasting 3-4 days light flow. Same partner. 1998 cryo-with normal Paps until 2014 HGSIL LEEP procedure positive endocervical margins, negative ectocervical margins. Normal Paps after. Anxiety and depression stable on Celexa, rare Xanax use.  Past medical history, past surgical history, family history and social history were all reviewed and documented in the EPIC chart. 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 Shanda BumpsJessica type 1 diabetes 17 doing well. 15.  ROS:  A ROS was performed and pertinent positives and negatives are included.  Exam:  Vitals:   10/12/16 1023  BP: 120/78  Weight: 147 lb (66.7 kg)  Height: 5\' 4"  (1.626 m)   Body mass index is 25.23 kg/m.   General appearance:  Normal Thyroid:  Symmetrical, normal in size, without palpable masses or nodularity. Respiratory  Auscultation:  Clear without wheezing or rhonchi Cardiovascular  Auscultation:  Regular rate, without rubs, murmurs or gallops  Edema/varicosities:  Not grossly evident Abdominal  Soft,nontender, without masses, guarding or rebound.  Liver/spleen:  No organomegaly noted  Hernia:  None appreciated  Skin  Inspection:  Grossly normal   Breasts: Examined lying and sitting.     Right: Without masses, retractions, discharge or axillary adenopathy.     Left: Without masses, retractions, discharge or axillary adenopathy. Gentitourinary   Inguinal/mons:  Normal without inguinal adenopathy  External genitalia:  Normal  BUS/Urethra/Skene's glands:  Normal  Vagina:  Normal  Cervix:  Normal  Uterus:   normal in size, shape and contour.  Midline and mobile  Adnexa/parametria:     Rt: Without masses or tenderness.   Lt: Without masses or tenderness.  Anus and perineum: Normal  Digital rectal exam: Normal sphincter tone without palpated masses or tenderness  Assessment/Plan:  51 y.o. D WF G2 P2 for  annual exam with no complaints.  Perimenopausal/irregular cycles using no contraception 2014 HGSIL LEEP procedure positive endocervical, negative ectocervical Anxiety depression stable on Celexa  Plan: Contraception reviewed and declined. Menopause reviewed. SBE's, continue annual 3-D mammogram history of dense breasts, reviewed due will schedule. Continue active lifestyle of regular exercise, calcium rich diet, vitamin D 1000 daily encouraged. Celexa 40 mg by mouth daily prescription, proper use given and reviewed. Xanax 0.5 prescription, proper use, addictive properties and to use sparingly, agreeable. CBC, CMP, lipid panel, UA, Pap with HR HPV typing, new screening guidelines reviewed.    Harrington ChallengerYOUNG,NANCY J WHNP, 1:11 PM 10/12/2016

## 2016-10-13 LAB — URINALYSIS W MICROSCOPIC + REFLEX CULTURE
BILIRUBIN URINE: NEGATIVE
Bacteria, UA: NONE SEEN [HPF]
CASTS: NONE SEEN [LPF]
Crystals: NONE SEEN [HPF]
Glucose, UA: NEGATIVE
Hgb urine dipstick: NEGATIVE
KETONES UR: NEGATIVE
Leukocytes, UA: NEGATIVE
Nitrite: NEGATIVE
Protein, ur: NEGATIVE
RBC / HPF: NONE SEEN RBC/HPF (ref <=2)
SPECIFIC GRAVITY, URINE: 1.018 (ref 1.001–1.035)
Yeast: NONE SEEN [HPF]
pH: 6 (ref 5.0–8.0)

## 2016-10-14 LAB — PAP, TP IMAGING W/ HPV RNA, RFLX HPV TYPE 16,18/45: HPV mRNA, High Risk: DETECTED — AB

## 2016-10-14 LAB — HPV TYPE 16 AND 18/45 RNA
HPV TYPE 16 RNA: DETECTED — AB
HPV Type 18/45 RNA: NOT DETECTED

## 2016-10-14 LAB — URINE CULTURE: Organism ID, Bacteria: NO GROWTH

## 2016-11-05 ENCOUNTER — Encounter: Payer: Self-pay | Admitting: Women's Health

## 2016-11-18 ENCOUNTER — Other Ambulatory Visit: Payer: Self-pay | Admitting: Women's Health

## 2016-11-18 DIAGNOSIS — F419 Anxiety disorder, unspecified: Secondary | ICD-10-CM

## 2016-11-24 ENCOUNTER — Ambulatory Visit: Payer: 59 | Admitting: Gynecology

## 2016-12-21 ENCOUNTER — Ambulatory Visit (INDEPENDENT_AMBULATORY_CARE_PROVIDER_SITE_OTHER): Payer: 59 | Admitting: Gynecology

## 2016-12-21 ENCOUNTER — Encounter: Payer: Self-pay | Admitting: Gynecology

## 2016-12-21 VITALS — BP 124/78

## 2016-12-21 DIAGNOSIS — R87810 Cervical high risk human papillomavirus (HPV) DNA test positive: Secondary | ICD-10-CM | POA: Diagnosis not present

## 2016-12-21 NOTE — Patient Instructions (Signed)
Office will call you with biopsy results 

## 2016-12-21 NOTE — Progress Notes (Addendum)
    Mackenzie Thomas [birth year] [id number]        51 y.o.  [id number] with history of Pap smear showing ASCUS with positive high-risk HPV 04/2013. Follow up colposcopy with biopsy showed HGSIL on biopsy and ECC. Underwent LEEP where final pathology showed HGSIL with endocervical glandular extension and dysplasia present at the endocervical margin. ECC showed detached fragments of dysplastic epithelium. Follow up Pap smear 2015 normal with negative HPV. Pap smear 2017 normal. Pap smear 2018 showed normal cytology but positive high-risk HPV subtype 16. Presents for colposcopy.  Past medical history,surgical history, problem list, medications, allergies, family history and social history were all reviewed and documented in the EPIC chart.  Directed ROS with pertinent positives and negatives documented in the history of present illness/assessment and plan.  Exam: Mackenzie Thomas assistant Vitals:   12/21/16 1007  BP: 124/78   General appearance:  Normal Abdomen soft nontender without masses guarding rebound Pelvic external BUS vagina normal. Cervix with LEEP changes and small external os. Uterus normal size midline mobile nontender. Adnexa without masses or tenderness.  Colposcopy performed after acetic acid cleanse was adequate with manipulation of the external os with no abnormalities seen. ECC was performed. Patient tolerated well. Physical Exam  Genitourinary:       Assessment/Plan:  51 y.o. [id number] with history as above. Colposcopy was normal. ECC performed. Patient will follow up for results. I discussed various scenarios with her. If ECC negative and plan follow up Pap smear/HPV one year. If abnormal then will triage based upon results    Mackenzie Lords MD, 10:40 AM 12/21/2016

## 2016-12-22 LAB — PATHOLOGY

## 2017-01-05 ENCOUNTER — Telehealth: Payer: Self-pay | Admitting: Gynecology

## 2017-01-06 NOTE — Telephone Encounter (Signed)
Erroneous entry

## 2017-01-10 ENCOUNTER — Encounter: Payer: 59 | Admitting: Gynecology

## 2017-02-11 HISTORY — PX: LEEP: SHX91

## 2017-02-23 ENCOUNTER — Encounter: Payer: Self-pay | Admitting: Gynecology

## 2017-02-23 ENCOUNTER — Ambulatory Visit (INDEPENDENT_AMBULATORY_CARE_PROVIDER_SITE_OTHER): Payer: 59 | Admitting: Gynecology

## 2017-02-23 VITALS — BP 114/70

## 2017-02-23 DIAGNOSIS — R87613 High grade squamous intraepithelial lesion on cytologic smear of cervix (HGSIL): Secondary | ICD-10-CM | POA: Diagnosis not present

## 2017-02-23 NOTE — Patient Instructions (Signed)
Office will call with biopsy results 

## 2017-02-23 NOTE — Progress Notes (Signed)
    Mackenzie FriskStacey B Thomas 28-Jul-1966 [id number]        51 y.o.  [id number] presents for LEEP procedure.  The patient and I reviewed her indications for LEEP:  History of Pap smear showing ASCUS with positive high-risk HPV 04/2013. Follow up colposcopy with biopsy showed HGSIL on biopsy and ECC. Underwent LEEP where final pathology showed HGSIL with endocervical glandular extension and dysplasia present at the endocervical margin. ECC showed detached fragments of dysplastic epithelium. Follow up Pap smear 2015 normal with negative HPV. Pap smear 2017 normal. Pap smear 2018 showed normal cytology but positive high-risk HPV subtype 16.  Follow up colposcopy was adequate normal with ECC showing HGSIL, moderate dysplasia, CIN 2  I reviewed the procedure with her to include the risks of infection, bleeding requiring retreatment and damage to surrounding tissues including vagina, bladder and rectum.  The various pathology result possibilities were reviewed with her to include pathology found with complete excision, cut through lesions with need for possible future treatments, as well as no pathology found.  She understands that these lesions are HPV related and that we are not eradicating the virus, with possibilities of recurrences in the future. Patient understands and accepts the above.   Procedure:   A pelvic exam was performed and was normal.   The patient was properly grounded and prepared for the procedure. The cervix was visualized with a LEEP speculum cleansed with acetic acid and re-colposcopy performed. The cervix was circumferentially injected with 2% lidocaine solution with epinephrine 1 / 100,000 dilution, a total of 7 cc's were used. The cervix was then stained with Lugol's solution. The LEEP generator was set at 7060 W cutting 60 W coagulation blend one current. Using the 12 x 15 LEEP wand, the LEEP specimen was excised in a single pass. Follow up ECC was performed.  Ball coagulation was delivered to the LEEP  base to achieve hemostasis and prophylactic Monsel's solution was applied. The specimen was cut open at 3 o'clock and pinned open on the cork and sent to pathology. The patient tolerated the procedure well and postoperative instructions were discussed with her and a postoperative instruction sheet was given to her. Patient knows to follow up for pathology results in several days and then we will discuss the long-term plan as far as follow up screening.   Dara LordsFONTAINE,Jahmere Bramel P MD, 3:33 PM 02/23/2017

## 2017-02-25 LAB — PATHOLOGY

## 2017-02-27 ENCOUNTER — Encounter: Payer: Self-pay | Admitting: Gynecology

## 2017-03-02 ENCOUNTER — Other Ambulatory Visit: Payer: Self-pay | Admitting: Women's Health

## 2017-03-02 DIAGNOSIS — F419 Anxiety disorder, unspecified: Secondary | ICD-10-CM

## 2017-03-03 ENCOUNTER — Other Ambulatory Visit: Payer: Self-pay

## 2017-03-03 DIAGNOSIS — F5101 Primary insomnia: Secondary | ICD-10-CM

## 2017-03-03 MED ORDER — ZOLPIDEM TARTRATE 10 MG PO TABS: ORAL_TABLET | ORAL | 1 refills | Status: DC

## 2017-03-03 NOTE — Telephone Encounter (Signed)
Rx called in to pharmacy. 

## 2017-03-03 NOTE — Telephone Encounter (Signed)
Called into pharmacy

## 2017-03-07 ENCOUNTER — Other Ambulatory Visit: Payer: Self-pay | Admitting: Women's Health

## 2017-03-07 DIAGNOSIS — F5101 Primary insomnia: Secondary | ICD-10-CM

## 2017-03-08 ENCOUNTER — Telehealth: Payer: Self-pay

## 2017-03-08 MED ORDER — CLINDAMYCIN PHOSPHATE 2 % VA CREA: TOPICAL_CREAM | VAGINAL | 0 refills | Status: DC

## 2017-03-08 NOTE — Telephone Encounter (Signed)
Had LEEP on 02/23/17. C/O yellowish vaginal discharge with foul odor. Has to change panty liner half way through day. A little cramping every now and then.

## 2017-03-08 NOTE — Telephone Encounter (Signed)
Recommend Cleocin vaginal cream nightly 7 nights. 

## 2017-03-08 NOTE — Telephone Encounter (Signed)
Ol for refill, I don't think she just had refilled yesterday?

## 2017-03-08 NOTE — Telephone Encounter (Signed)
Called pharmacy. The prescription hadnt been filled. Refill given KW CMA

## 2017-03-08 NOTE — Telephone Encounter (Signed)
Patient informed. Rx sent 

## 2017-04-11 ENCOUNTER — Other Ambulatory Visit: Payer: Self-pay | Admitting: Women's Health

## 2017-04-11 DIAGNOSIS — F5101 Primary insomnia: Secondary | ICD-10-CM

## 2017-04-11 NOTE — Telephone Encounter (Signed)
Ok for refill? 

## 2017-04-11 NOTE — Telephone Encounter (Signed)
Called into pharmacy

## 2017-06-02 ENCOUNTER — Encounter: Payer: Self-pay | Admitting: Gynecology

## 2017-06-02 ENCOUNTER — Ambulatory Visit (INDEPENDENT_AMBULATORY_CARE_PROVIDER_SITE_OTHER): Payer: 59 | Admitting: Gynecology

## 2017-06-02 VITALS — BP 118/76

## 2017-06-02 DIAGNOSIS — N951 Menopausal and female climacteric states: Secondary | ICD-10-CM

## 2017-06-02 DIAGNOSIS — R87613 High grade squamous intraepithelial lesion on cytologic smear of cervix (HGSIL): Secondary | ICD-10-CM

## 2017-06-02 NOTE — Patient Instructions (Signed)
Office will call you with biopsy results 

## 2017-06-02 NOTE — Progress Notes (Signed)
    JA PISTOLE 07/25/1966 [id number]        51 y.o.  G2P2002 presents for colposcopy. 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 Colposcopy was normal with ECC showing HGSIL. Patient underwent subsequent LEEP 02/2017 which showed HGSIL with endocervical margin involvement. Presents now for colposcopy.  Past medical history,surgical history, problem list, medications, allergies, family history and social history were all reviewed and documented in the EPIC chart.  Directed ROS with pertinent positives and negatives documented in the history of present illness/assessment and plan.  Exam: [name] assistant Vitals:   06/02/17 1102  BP: 118/76   General appearance:  Normal Abdomen soft nontender without masses guarding rebound Pelvic external BUS vagina normal. Cervix normal stenotic in appearance. Uterus normal size midline mobile nontender. Adnexa without masses or tenderness  Colposcopy performed after acetic acid cleanse is inadequate with no transformation zone seen. Cervix was stenotic and using a disposable dilator was dilated and subsequent ECC performed. Patient tolerated well. Physical Exam  Genitourinary:       Assessment/Plan:  51 y.o. [id number] with history as above. Colposcopy was inadequate but no other abnormalities seen. ECC performed.  Reviewed situation with the patient. Persistent high-grade dysplasia despite 2 LEEPS. If ECC negative then will follow expectantly. If ECC positive then recommended proceeding with hysterectomy. Options for repeat LEEP also discussed but I think given the total picture hysterectomy would be most appropriate. She also understands if ECC is negative and we follow expectantly that she is at risk of persistent/redevelopment of dysplasia requiring treatment in the future.  Patient also complaining of menopausal  symptoms. Has been over a year without menses. She is having hot flushes and sweats. Options for management reviewed with the patient. She currently is on Celexa. OTC products up to and including HRT reviewed. Patient not interested in HRT. Will try OTC products. Will follow up if this continues to be a problem.    [name], 11:19 AM 06/02/2017

## 2017-06-06 LAB — TISSUE SPECIMEN

## 2017-06-06 LAB — PATHOLOGY

## 2017-06-09 ENCOUNTER — Institutional Professional Consult (permissible substitution): Payer: 59 | Admitting: Gynecology

## 2017-06-16 ENCOUNTER — Telehealth: Payer: Self-pay

## 2017-06-16 ENCOUNTER — Encounter: Payer: Self-pay | Admitting: Gynecology

## 2017-06-16 ENCOUNTER — Ambulatory Visit (INDEPENDENT_AMBULATORY_CARE_PROVIDER_SITE_OTHER): Payer: 59 | Admitting: Gynecology

## 2017-06-16 VITALS — BP 120/76

## 2017-06-16 DIAGNOSIS — R87613 High grade squamous intraepithelial lesion on cytologic smear of cervix (HGSIL): Secondary | ICD-10-CM

## 2017-06-16 MED ORDER — MISOPROSTOL 200 MCG PO TABS: ORAL_TABLET | ORAL | 0 refills | Status: DC

## 2017-06-16 NOTE — Progress Notes (Addendum)
    Mackenzie Thomas 03/12/1966 [id number]        51 y.o.  G2P2002 presents with her husband to discuss her most recent colposcopy results and treatment plan. History of cryosurgery by Dr. Elana Thomas a number of years ago. Subsequent only had LEEP 2014 with HGSIL with endocervical margin involvement. Pap smear 2017 showed high risk HPV subtype 16 with subsequent ECC showing HGSIL. LEEP 02/2017 showed HGSIL with endocervical margin involvement. Colposcopy 05/2017 was inadequate with no abnormalities seen. ECC showed HGSIL.  Past medical history,surgical history, problem list, medications, allergies, family history and social history were all reviewed and documented in the EPIC chart.  Directed ROS with pertinent positives and negatives documented in the history of present illness/assessment and plan.  Exam: Mackenzie Thomas assistant Vitals:   06/16/17 0811  BP: 120/76   General appearance:  Normal Abdomen soft nontender without masses guarding rebound Pelvic external BUS vagina normal. Cervix normal. Uterus normal size midline mobile nontender. Adnexa without masses or tenderness.  Assessment/Plan:  51 y.o. [id number] with persistent HGSIL of the endocervix in a perimenopausal patient noting last menses last year with increasing hot flushes. Using vasectomy contraception. Options were management were reviewed with the patient and her husband to include conservative such as Cone biopsy or repeat LEEP with a more aggressive endocervical excision versus proceeding with hysterectomy. She understands with hysterectomy no guarantee that she would not develop vaginal dysplasia in the future secondary to HPV relationship. The need to continue sampling the vagina was discussed. We also discussed the ovarian conservation issue in her perimenopausal time frame the options to remove both ovaries versus keeping both ovaries for ongoing hormone production was discussed. The risks of keeping both ovaries to include ovarian  disease in the future up to including ovarian cancer was reviewed. Removing her ovaries and worsening menopausal symptoms also discussed. Possibility for HRT was reviewed and the issues of HRT discussed to include the various studies of risk versus benefit. Increased risk of thrombosis such as stroke heart attack DVT and the breast cancer issue was discussed. Benefits to include symptom relief and cardiovascular and bone health was also reviewed. Patient wants to think about her ovarian conservation decision but she definitely wants to proceed with hysterectomy. Exam shows good mobility and we will approach as a vaginal hysterectomy with LAVH back up. She does understand that at any time during the procedure we could convert to a TAH with a larger incision and a longer recovery period.  I discussed in general what's involved with the surgery as well as what to expect with recovery period patient and her husband's questions were answered and she is ready to proceed with surgery scheduled this at her convenience.    Greater than 50% of my 25 minute visit was spent in direct face to face counseling and coordination of care with the patient.     Mackenzie Lords MD, 9:00 AM 06/16/2017        Mackenzie Lords MD, 9:00 AM 06/16/2017

## 2017-06-16 NOTE — Patient Instructions (Signed)
Office will call you to schedule surgery.

## 2017-06-16 NOTE — Telephone Encounter (Signed)
Patient called about scheduling surgery. I reviewed her ins benefits and her estimated surgery prepymt amount.  Financial letter will be sent.  Pre-op appt was scheduled with Dr. Velvet Bathe.  I advised patient regarding need for Cytotec tab vaginally hs before surgery and Rx was sent.

## 2017-06-20 ENCOUNTER — Encounter: Payer: Self-pay | Admitting: Gynecology

## 2017-07-11 ENCOUNTER — Telehealth: Payer: Self-pay

## 2017-07-11 NOTE — Telephone Encounter (Signed)
Patient called to see what recovery time is for Vag Hyst. Advised 4-6 weeks depending on what she is doing. She asked about driving and I told her if all is well at 2 weeks post op visit driving will probably be fine.

## 2017-07-21 ENCOUNTER — Telehealth: Payer: Self-pay

## 2017-07-21 NOTE — Telephone Encounter (Signed)
8 weeks

## 2017-07-21 NOTE — Telephone Encounter (Signed)
Patient is scheduled for Vag Hyst on 08/02/2017.  She said her job is wanting her to travel by airplane on 08/14/17 on business. She wants me to mail her a letter stating no extended travel.  What time frame should I put on that restriction?

## 2017-07-21 NOTE — Telephone Encounter (Signed)
At Dr. Reynold BowenFontaine's request I checked with patient to see if she would accept blood transfusion at time of surgery. Patient said she would accept transfusion.

## 2017-07-21 NOTE — Telephone Encounter (Signed)
Letter prepared and mailed to patient at her request.

## 2017-07-23 ENCOUNTER — Other Ambulatory Visit: Payer: Self-pay | Admitting: Women's Health

## 2017-07-23 DIAGNOSIS — F419 Anxiety disorder, unspecified: Secondary | ICD-10-CM

## 2017-07-25 NOTE — Telephone Encounter (Signed)
Rx Called in.

## 2017-07-25 NOTE — Telephone Encounter (Signed)
Ok for refill? 

## 2017-07-25 NOTE — Patient Instructions (Addendum)
Your procedure is scheduled on:  Tuesday, Nov 20  [facility] at: 6 am   Pick up the phone at the desk and dial [phone].  Call this number if you have problems the morning of surgery: [phone].  Remember: Do NOT eat food or drink clear liquids (including water) after midnight Monday.  Take these medicines the morning of surgery with a SIP OF WATER: zyrtec if needed.  Stop herbal medications and supplements at this time.  Do NOT wear jewelry (body piercing), metal hair clips/bobby pins, make-up, or nail polish. Do NOT wear lotions, powders, or perfumes.  You may wear deoderant. Do NOT shave for 48 hours prior to surgery. Do NOT bring valuables to the hospital..  Leave suitcase in car.  After surgery it may be brought to your room.  For patients admitted to the hospital, checkout time is 11:00 AM the day of discharge. Have a responsible adult drive you home and stay with you for 24 hours after your procedure.  Home with mother Eunice BlaseDebbie cell [phone] or boyfriend Roseanne RenoStewart cell [phone].

## 2017-07-26 ENCOUNTER — Encounter (HOSPITAL_COMMUNITY)
Admission: RE | Admit: 2017-07-26 | Discharge: 2017-07-26 | Disposition: A | Discharge status: Home / Self Care | Payer: 59 | Source: Ambulatory Visit | Attending: Gynecology | Admitting: Gynecology

## 2017-07-26 ENCOUNTER — Encounter: Payer: Self-pay | Admitting: Gynecology

## 2017-07-26 ENCOUNTER — Other Ambulatory Visit: Payer: Self-pay

## 2017-07-26 ENCOUNTER — Encounter (HOSPITAL_COMMUNITY): Payer: Self-pay

## 2017-07-26 ENCOUNTER — Ambulatory Visit (INDEPENDENT_AMBULATORY_CARE_PROVIDER_SITE_OTHER): Payer: 59 | Admitting: Gynecology

## 2017-07-26 VITALS — BP 118/76

## 2017-07-26 DIAGNOSIS — Z01812 Encounter for preprocedural laboratory examination: Secondary | ICD-10-CM | POA: Diagnosis present

## 2017-07-26 DIAGNOSIS — D069 Carcinoma in situ of cervix, unspecified: Secondary | ICD-10-CM | POA: Diagnosis not present

## 2017-07-26 LAB — COMPREHENSIVE METABOLIC PANEL
ALBUMIN: 4.3 g/dL (ref 3.5–5.0)
ALK PHOS: 61 U/L (ref 38–126)
ALT: 19 U/L (ref 14–54)
AST: 26 U/L (ref 15–41)
Anion gap: 7 (ref 5–15)
BILIRUBIN TOTAL: 0.4 mg/dL (ref 0.3–1.2)
BUN: 17 mg/dL (ref 6–20)
CALCIUM: 9 mg/dL (ref 8.9–10.3)
CO2: 26 mmol/L (ref 22–32)
Chloride: 101 mmol/L (ref 101–111)
Creatinine, Ser: 0.73 mg/dL (ref 0.44–1.00)
GFR calc Af Amer: >60 mL/min (ref >60)
Glucose, Bld: 121 mg/dL — ABNORMAL HIGH (ref 65–99)
POTASSIUM: 3.7 mmol/L (ref 3.5–5.1)
Sodium: 134 mmol/L — ABNORMAL LOW (ref 135–145)
TOTAL PROTEIN: 8.1 g/dL (ref 6.5–8.1)

## 2017-07-26 LAB — CBC
HEMATOCRIT: 39.1 % (ref 36.0–46.0)
HEMOGLOBIN: 13.2 g/dL (ref 12.0–15.0)
MCH: 31.5 pg (ref 26.0–34.0)
MCHC: 33.8 g/dL (ref 30.0–36.0)
MCV: 93.3 fL (ref 78.0–100.0)
Platelets: 239 10*3/uL (ref 150–400)
RBC: 4.19 MIL/uL (ref 3.87–5.11)
RDW: 12.7 % (ref 11.5–15.5)
WBC: 5.3 10*3/uL (ref 4.0–10.5)

## 2017-07-26 NOTE — H&P (Signed)
Mackenzie FriskStacey B Mccorry April 05, 1966 [id number]   History and Physical  Chief complaint: Persistent high-grade dysplasia  History of present illness: 51 y.o. G2P2002 with history of cryosurgery by Dr. Elana AlmMcPhail in number of years ago.  Subsequent HGSIL with LEEP 2014 with positive endocervical margin.  Pap smear 2017 with high risk HPV subtypes 16 and subsequent ECC showing HGSIL.  LEEP 02/2017 showed HGSIL with endocervical margin involvement.  Colposcopy 05/2017 was inadequate with no abnormality seen.  ECC showed HGSIL.  Options for management were reviewed with the patient to include cone biopsy or repeat LEEP versus proceeding with hysterectomy.  The patient elects for hysterectomy.  The ovarian conservation issue was reviewed below and she prefers to have both ovaries removed and [facility]  Past Medical History:  Diagnosis Date  . Anxiety   . 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 HGSIL (high grade squamous intraepithelial lesion) on Pap smear of cervix 02/2017   LEEP  . Seasonal allergies   . SVD (spontaneous vaginal delivery)    x 2    Past Surgical History:  Procedure Laterality Date  . CERVICAL BIOPSY  W/ LOOP ELECTRODE EXCISION  05/2013  . FOOT SURGERY Bilateral    removed calcium deposits on toes - bilateral feet  . GYNECOLOGIC CRYOSURGERY     At Dr. Lavon PaganiniMcPhail's office years ago  . LEEP  02/2017   HGSIL with involved endocervical margin  . WISDOM TOOTH EXTRACTION      Family History  Problem Relation Age of Onset  . Heart disease Father   . Diabetes Daughter     Social History:  reports that she quit smoking about 20 years ago. Her smoking use included cigarettes. She has a 18.00 pack-year smoking history. she has never used smokeless tobacco. She reports that she drinks about 4.2 oz of alcohol per week. She reports that she does not use drugs.  Allergies: No Known  Allergies  Medications: See Epic for latest update.  ROS:  Was performed and pertinent positives and negatives are included in the history of present illness.  Exam: Kennon PortelaKim Gardner assistant Vitals:   07/26/17 1127  BP: 118/76   General: well developed, well nourished female, no acute distress HEENT: normal  Lungs: clear to auscultation without wheezing, rales or rhonchi  Cardiac: regular rate without rubs, murmurs or gallops  Abdomen: soft, nontender without masses, guarding, rebound, organomegaly  Pelvic: external bus vagina: normal   Cervix: grossly normal  Uterus: normal size, midline and mobile, nontender  Adnexa: without masses or tenderness    Assessment/Plan:  51 y.o. [id number] with history of persistent high-grade dysplasia at the endocervical margin status post LEEP x2.  Options for management were reviewed from a conservative repeat LEEP or cone biopsy to hysterectomy and the patient elects for hysterectomy.  She understands that her dysplasia is associated with HPV and that there is no guarantee that she will not have issues with dysplasia at the vaginal cuff requiring treatment in the future and that ongoing vaginal surveillance is necessary following the hysterectomy.  She understands that we will initiate a vaginal approach but if I feel it is safer to remove the ovaries from a laparoscopic approach that we will convert to an LAVH or if it any time during the procedure I feel it is unsafe to proceed or complications arise that we will convert to a TAH with a larger  incision and a longer recovery was all discussed with her.  The absolute irreversible sterility following hysterectomy was discussed.  Sexuality following hysterectomy was also reviewed and the potential for persistent dyspareunia and orgasmic dysfunction was discussed.  The ovarian conservation issue was reviewed with her.  She has not had a menses in over a year but did have bleeding about a month ago.  She is not having  significant hot flashes or night sweats.  The options to keep both ovaries for transition through menopause from a hormonal standpoint recognizing she is at risk for ovarian disease in the future to include both benign and malignant versus removing both ovaries and the risk of becoming symptomatic requiring HRT was also discussed.   The issues of HRT was discussed with her to include the benefits of symptom relief as well as cardiovascular and bone health when started early versus potential risks to include thrombosis such as stroke heart attack DVT in the breast cancer issue was all reviewed.  At this point the patient desires removal of both ovaries along with her fallopian tubes and would plan on starting HRT following the surgery regardless of her symptoms to allow for more smooth transition understanding and accepting the risks of HRT.  The expected intraoperative and postoperative courses as well as the recovery period were reviewed. The risks of infection, prolonged antibiotics, reoperation for abscess or hematoma formation was discussed. The risks of hemorrhage necessitating transfusion and the risks of transfusion were discussed. Incisional complications to include opening and draining of incisions and closure by secondary intention, dehiscence and long-term issues of keloid/cosmetics and hernia formation were reviewed. The risk of inadvertent injury to internal organs including bowel, bladder, ureters, vessels, nerves either immediately recognized or delay recognized necessitating major exploratory reparative surgeries and future reparative surgeries including bowel resection, ostomy formation, bladder repair, ureteral damage repair was discussed with her. The patient's questions were answered to her satisfaction and she is ready to proceed with surgery.    Dara LordsFONTAINE,Myria Steenbergen P MD, 1:13 PM 07/26/2017

## 2017-07-26 NOTE — Patient Instructions (Signed)
Followup for surgery as scheduled. 

## 2017-07-26 NOTE — Progress Notes (Signed)
Mackenzie FriskStacey B Thomas 01/06/1966 [id number]   Preoperative consult  Chief complaint: Persistent high-grade dysplasia  History of present illness: 51 y.o. G2P2002 with history of cryosurgery by Dr. Elana AlmMcPhail in number of years ago.  Subsequent HGSIL with LEEP 2014 with positive endocervical margin.  Pap smear 2017 with high risk HPV subtypes 16 and subsequent ECC showing HGSIL.  LEEP 02/2017 showed HGSIL with endocervical margin involvement.  Colposcopy 05/2017 was inadequate with no abnormality seen.  ECC showed HGSIL.  Options for management were reviewed with the patient to include cone biopsy or repeat LEEP versus proceeding with hysterectomy.  The patient elects for hysterectomy.  The ovarian conservation issue was reviewed below and she prefers to have both ovaries removed and [facility]  Past Medical History:  Diagnosis Date  . Anxiety   . 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 HGSIL (high grade squamous intraepithelial lesion) on Pap smear of cervix 02/2017   LEEP  . Seasonal allergies   . SVD (spontaneous vaginal delivery)    x 2    Past Surgical History:  Procedure Laterality Date  . CERVICAL BIOPSY  W/ LOOP ELECTRODE EXCISION  05/2013  . FOOT SURGERY Bilateral    removed calcium deposits on toes - bilateral feet  . GYNECOLOGIC CRYOSURGERY     At Dr. Lavon PaganiniMcPhail's office years ago  . LEEP  02/2017   HGSIL with involved endocervical margin  . WISDOM TOOTH EXTRACTION      Family History  Problem Relation Age of Onset  . Heart disease Father   . Diabetes Daughter     Social History:  reports that she quit smoking about 20 years ago. Her smoking use included cigarettes. She has a 18.00 pack-year smoking history. she has never used smokeless tobacco. She reports that she drinks about 4.2 oz of alcohol per week. She reports that she does not use drugs.  Allergies: No Known  Allergies  Medications: See Epic for latest update.  ROS:  Was performed and pertinent positives and negatives are included in the history of present illness.  Exam: Kennon PortelaKim Gardner assistant Vitals:   07/26/17 1127  BP: 118/76   General: well developed, well nourished female, no acute distress HEENT: normal  Lungs: clear to auscultation without wheezing, rales or rhonchi  Cardiac: regular rate without rubs, murmurs or gallops  Abdomen: soft, nontender without masses, guarding, rebound, organomegaly  Pelvic: external bus vagina: normal   Cervix: grossly normal  Uterus: normal size, midline and mobile, nontender  Adnexa: without masses or tenderness    Assessment/Plan:  51 y.o. [id number] with history of persistent high-grade dysplasia at the endocervical margin status post LEEP x2.  Options for management were reviewed from a conservative repeat LEEP or cone biopsy to hysterectomy and the patient elects for hysterectomy.  She understands that her dysplasia is associated with HPV and that there is no guarantee that she will not have issues with dysplasia at the vaginal cuff requiring treatment in the future and that ongoing vaginal surveillance is necessary following the hysterectomy.  She understands that we will initiate a vaginal approach but if I feel it is safer to remove the ovaries from a laparoscopic approach that we will convert to an LAVH or if it any time during the procedure I feel it is unsafe to proceed or complications arise that we will convert to a TAH with a larger incision  and a longer recovery was all discussed with her.  The absolute irreversible sterility following hysterectomy was discussed.  Sexuality following hysterectomy was also reviewed and the potential for persistent dyspareunia and orgasmic dysfunction was discussed.  The ovarian conservation issue was reviewed with her.  She has not had a menses in over a year but did have bleeding about a month ago.  She is not having  significant hot flashes or night sweats.  The options to keep both ovaries for transition through menopause from a hormonal standpoint recognizing she is at risk for ovarian disease in the future to include both benign and malignant versus removing both ovaries and the risk of becoming symptomatic requiring HRT was also discussed.   The issues of HRT was discussed with her to include the benefits of symptom relief as well as cardiovascular and bone health when started early versus potential risks to include thrombosis such as stroke heart attack DVT in the breast cancer issue was all reviewed.  At this point the patient desires removal of both ovaries along with her fallopian tubes and would plan on starting HRT following the surgery regardless of her symptoms to allow for more smooth transition understanding and accepting the risks of HRT.  The expected intraoperative and postoperative courses as well as the recovery period were reviewed. The risks of infection, prolonged antibiotics, reoperation for abscess or hematoma formation was discussed. The risks of hemorrhage necessitating transfusion and the risks of transfusion were discussed. Incisional complications to include opening and draining of incisions and closure by secondary intention, dehiscence and long-term issues of keloid/cosmetics and hernia formation were reviewed. The risk of inadvertent injury to internal organs including bowel, bladder, ureters, vessels, nerves either immediately recognized or delay recognized necessitating major exploratory reparative surgeries and future reparative surgeries including bowel resection, ostomy formation, bladder repair, ureteral damage repair was discussed with her. The patient's questions were answered to her satisfaction and she is ready to proceed with surgery.    Dara LordsFONTAINE,Mackenzie Thomas P MD, 1:01 PM 07/26/2017

## 2017-07-27 ENCOUNTER — Other Ambulatory Visit: Payer: Self-pay | Admitting: Gynecology

## 2017-07-27 ENCOUNTER — Telehealth: Payer: Self-pay

## 2017-07-27 MED ORDER — PHENAZOPYRIDINE HCL 200 MG PO TABS: ORAL_TABLET | ORAL | 0 refills | Status: DC

## 2017-07-27 NOTE — Telephone Encounter (Signed)
I called patient and informed her Dr. Velvet BatheF wants her to take Pyridium tabs one night before surgery and one morning of surgery with a tiny sip of water.  Rx sent. Patient wanted to be sure Cytotec tab is ready and I spoke with pharmacy and it had been put on file since I sent it for her back in Oct and she never picked it up. They will get that Rx ready for her as well. At patient's request I left her a message confirming both Rx's are at pharmacy.

## 2017-08-01 NOTE — Anesthesia Preprocedure Evaluation (Signed)
Anesthesia Evaluation  Patient identified by MRN, date of birth, ID band Patient awake    Reviewed: Allergy & Precautions, H&P , Patient's Chart, lab work & pertinent test results, reviewed documented beta blocker date and time   Airway Mallampati: II  TM Distance: >3 FB Neck ROM: full    Dental no notable dental hx.    Pulmonary former smoker,    Pulmonary exam normal breath sounds clear to auscultation       Cardiovascular  Rhythm:regular Rate:Normal     Neuro/Psych    GI/Hepatic   Endo/Other    Renal/GU      Musculoskeletal   Abdominal   Peds  Hematology   Anesthesia Other Findings   Reproductive/Obstetrics                             Anesthesia Physical Anesthesia Plan  ASA: II  Anesthesia Plan: General   Post-op Pain Management:    Induction: Intravenous  PONV Risk Score and Plan: 3 and Scopolamine patch - Pre-op, Dexamethasone and Ondansetron  Airway Management Planned: Oral ETT  Additional Equipment:   Intra-op Plan:   Post-operative Plan: Extubation in OR  Informed Consent: I have reviewed the patients History and Physical, chart, labs and discussed the procedure including the risks, benefits and alternatives for the proposed anesthesia with the patient or authorized representative who has indicated his/her understanding and acceptance.   Dental Advisory Given  Plan Discussed with: CRNA and Surgeon  Anesthesia Plan Comments: (  )        Anesthesia Quick Evaluation

## 2017-08-02 ENCOUNTER — Ambulatory Visit (HOSPITAL_COMMUNITY): Payer: 59 | Admitting: Anesthesiology

## 2017-08-02 ENCOUNTER — Encounter (HOSPITAL_COMMUNITY): Payer: Self-pay

## 2017-08-02 ENCOUNTER — Ambulatory Visit (HOSPITAL_COMMUNITY)
Admission: RE | Admit: 2017-08-02 | Discharge: 2017-08-03 | Disposition: A | Discharge status: Home / Self Care | Payer: 59 | Source: Ambulatory Visit | Attending: Gynecology | Admitting: Gynecology

## 2017-08-02 ENCOUNTER — Other Ambulatory Visit: Payer: Self-pay

## 2017-08-02 ENCOUNTER — Encounter (HOSPITAL_COMMUNITY)
Admission: RE | Disposition: A | Discharge status: Home / Self Care | Payer: Self-pay | Source: Ambulatory Visit | Attending: Gynecology

## 2017-08-02 DIAGNOSIS — F419 Anxiety disorder, unspecified: Secondary | ICD-10-CM | POA: Insufficient documentation

## 2017-08-02 DIAGNOSIS — D061 Carcinoma in situ of exocervix: Secondary | ICD-10-CM | POA: Insufficient documentation

## 2017-08-02 DIAGNOSIS — N879 Dysplasia of cervix uteri, unspecified: Secondary | ICD-10-CM | POA: Diagnosis present

## 2017-08-02 DIAGNOSIS — Z23 Encounter for immunization: Secondary | ICD-10-CM | POA: Insufficient documentation

## 2017-08-02 DIAGNOSIS — D069 Carcinoma in situ of cervix, unspecified: Secondary | ICD-10-CM

## 2017-08-02 DIAGNOSIS — Z87891 Personal history of nicotine dependence: Secondary | ICD-10-CM | POA: Insufficient documentation

## 2017-08-02 DIAGNOSIS — Z79899 Other long term (current) drug therapy: Secondary | ICD-10-CM | POA: Diagnosis not present

## 2017-08-02 HISTORY — PX: SALPINGOOPHORECTOMY: SHX82

## 2017-08-02 HISTORY — PX: CYSTOSCOPY: SHX5120

## 2017-08-02 HISTORY — PX: VAGINAL HYSTERECTOMY: SHX2639

## 2017-08-02 LAB — PREGNANCY, URINE: PREG TEST UR: NEGATIVE

## 2017-08-02 SURGERY — HYSTERECTOMY, VAGINAL
Anesthesia: General | Site: Vagina

## 2017-08-02 MED ORDER — ACETAMINOPHEN 160 MG/5ML PO SOLN
ORAL | Status: AC
Start: 2017-08-02 — End: 2017-08-02
  Administered 2017-08-02: 975 mg via ORAL
  Filled 2017-08-02: qty 40.6

## 2017-08-02 MED ORDER — GLYCOPYRROLATE 0.2 MG/ML IJ SOLN
INTRAMUSCULAR | Status: DC | PRN
  Administered 2017-08-02: 0.1 mg via INTRAVENOUS

## 2017-08-02 MED ORDER — FENTANYL CITRATE (PF) 100 MCG/2ML IJ SOLN
INTRAMUSCULAR | Status: DC | PRN
  Administered 2017-08-02 (×3): 50 ug via INTRAVENOUS
  Administered 2017-08-02: 100 ug via INTRAVENOUS

## 2017-08-02 MED ORDER — HYDROMORPHONE HCL 1 MG/ML IJ SOLN
INTRAMUSCULAR | Status: AC
  Administered 2017-08-02: 0.25 mg via INTRAVENOUS
  Filled 2017-08-02: qty 0.5

## 2017-08-02 MED ORDER — MIDAZOLAM HCL 2 MG/2ML IJ SOLN
INTRAMUSCULAR | Status: AC
  Filled 2017-08-02: qty 2

## 2017-08-02 MED ORDER — CITALOPRAM HYDROBROMIDE 40 MG PO TABS
40.0000 mg | ORAL_TABLET | Freq: Every evening | ORAL | Status: DC
  Filled 2017-08-02: qty 1

## 2017-08-02 MED ORDER — FENTANYL CITRATE (PF) 250 MCG/5ML IJ SOLN
INTRAMUSCULAR | Status: AC
  Filled 2017-08-02: qty 5

## 2017-08-02 MED ORDER — LACTATED RINGERS IV SOLN
INTRAVENOUS | Status: DC
  Administered 2017-08-02 (×2): via INTRAVENOUS

## 2017-08-02 MED ORDER — SCOPOLAMINE 1 MG/3DAYS TD PT72
1.0000 | MEDICATED_PATCH | Freq: Once | TRANSDERMAL | Status: DC
  Administered 2017-08-02: 1.5 mg via TRANSDERMAL

## 2017-08-02 MED ORDER — KETOROLAC TROMETHAMINE 30 MG/ML IJ SOLN
30.0000 mg | Freq: Four times a day (QID) | INTRAMUSCULAR | Status: DC
  Administered 2017-08-02 – 2017-08-03 (×4): 30 mg via INTRAVENOUS
  Filled 2017-08-02 (×4): qty 1

## 2017-08-02 MED ORDER — DIPHENHYDRAMINE HCL 25 MG PO CAPS
50.0000 mg | ORAL_CAPSULE | Freq: Four times a day (QID) | ORAL | Status: DC | PRN
  Administered 2017-08-02: 50 mg via ORAL
  Filled 2017-08-02: qty 2

## 2017-08-02 MED ORDER — KETOROLAC TROMETHAMINE 30 MG/ML IJ SOLN
INTRAMUSCULAR | Status: AC
Start: 2017-08-02 — End: 2017-08-02
  Administered 2017-08-02: 30 mg via INTRAVENOUS
  Filled 2017-08-02: qty 1

## 2017-08-02 MED ORDER — BUPIVACAINE HCL (PF) 0.25 % IJ SOLN
INTRAMUSCULAR | Status: AC
  Filled 2017-08-02: qty 30

## 2017-08-02 MED ORDER — CEFOTETAN DISODIUM-DEXTROSE 2-2.08 GM-%(50ML) IV SOLR
INTRAVENOUS | Status: AC
  Filled 2017-08-02: qty 50

## 2017-08-02 MED ORDER — HYDROMORPHONE HCL 1 MG/ML IJ SOLN
0.2500 mg | INTRAMUSCULAR | Status: DC | PRN
  Administered 2017-08-02 (×2): 0.25 mg via INTRAVENOUS

## 2017-08-02 MED ORDER — SIMETHICONE 80 MG PO CHEW
80.0000 mg | CHEWABLE_TABLET | Freq: Four times a day (QID) | ORAL | Status: DC | PRN
  Administered 2017-08-02: 80 mg via ORAL
  Filled 2017-08-02: qty 1

## 2017-08-02 MED ORDER — HYDROMORPHONE HCL 1 MG/ML IJ SOLN
INTRAMUSCULAR | Status: AC
  Filled 2017-08-02: qty 1

## 2017-08-02 MED ORDER — DEXAMETHASONE SODIUM PHOSPHATE 4 MG/ML IJ SOLN
INTRAMUSCULAR | Status: DC | PRN
  Administered 2017-08-02: 10 mg via INTRAVENOUS

## 2017-08-02 MED ORDER — ONDANSETRON HCL 4 MG/2ML IJ SOLN
INTRAMUSCULAR | Status: AC
  Filled 2017-08-02: qty 2

## 2017-08-02 MED ORDER — SCOPOLAMINE 1 MG/3DAYS TD PT72
MEDICATED_PATCH | TRANSDERMAL | Status: AC
  Administered 2017-08-02: 1.5 mg via TRANSDERMAL
  Filled 2017-08-02: qty 1

## 2017-08-02 MED ORDER — ONDANSETRON HCL 4 MG/2ML IJ SOLN: 4.0000 mg | Freq: Four times a day (QID) | INTRAMUSCULAR | Status: DC | PRN

## 2017-08-02 MED ORDER — LIDOCAINE HCL (CARDIAC) 20 MG/ML IV SOLN
INTRAVENOUS | Status: DC | PRN
  Administered 2017-08-02: 100 mg via INTRAVENOUS

## 2017-08-02 MED ORDER — 0.9 % SODIUM CHLORIDE (POUR BTL) OPTIME
TOPICAL | Status: DC | PRN
  Administered 2017-08-02: 1000 mL

## 2017-08-02 MED ORDER — LIDOCAINE-EPINEPHRINE 1 %-1:100000 IJ SOLN
INTRAMUSCULAR | Status: AC
  Filled 2017-08-02: qty 1

## 2017-08-02 MED ORDER — SUGAMMADEX SODIUM 200 MG/2ML IV SOLN
INTRAVENOUS | Status: AC
  Filled 2017-08-02: qty 2

## 2017-08-02 MED ORDER — STERILE WATER FOR IRRIGATION IR SOLN
Status: DC | PRN
Start: 2017-08-02 — End: 2017-08-02
  Administered 2017-08-02: 1000 mL via INTRAVESICAL

## 2017-08-02 MED ORDER — HYDROMORPHONE HCL 1 MG/ML IJ SOLN
INTRAMUSCULAR | Status: DC | PRN
  Administered 2017-08-02: 1 mg via INTRAVENOUS

## 2017-08-02 MED ORDER — ROCURONIUM BROMIDE 100 MG/10ML IV SOLN
INTRAVENOUS | Status: AC
  Filled 2017-08-02: qty 1

## 2017-08-02 MED ORDER — PROPOFOL 10 MG/ML IV BOLUS
INTRAVENOUS | Status: DC | PRN
  Administered 2017-08-02: 150 mg via INTRAVENOUS

## 2017-08-02 MED ORDER — SUGAMMADEX SODIUM 200 MG/2ML IV SOLN
INTRAVENOUS | Status: DC | PRN
  Administered 2017-08-02: 140 mg via INTRAVENOUS

## 2017-08-02 MED ORDER — OXYCODONE-ACETAMINOPHEN 5-325 MG PO TABS
1.0000 | ORAL_TABLET | ORAL | Status: DC | PRN
  Administered 2017-08-02: 2 via ORAL
  Administered 2017-08-03: 1 via ORAL
  Administered 2017-08-03: 2 via ORAL
  Administered 2017-08-03: 1 via ORAL
  Filled 2017-08-02 (×2): qty 2
  Filled 2017-08-02: qty 1
  Filled 2017-08-02: qty 2
  Filled 2017-08-02: qty 1

## 2017-08-02 MED ORDER — INFLUENZA VAC SPLIT QUAD 0.5 ML IM SUSY
0.5000 mL | PREFILLED_SYRINGE | INTRAMUSCULAR | Status: AC
  Administered 2017-08-03: 0.5 mL via INTRAMUSCULAR
  Filled 2017-08-02: qty 0.5

## 2017-08-02 MED ORDER — ONDANSETRON HCL 4 MG PO TABS: 4.0000 mg | ORAL_TABLET | Freq: Four times a day (QID) | ORAL | Status: DC | PRN

## 2017-08-02 MED ORDER — MIDAZOLAM HCL 2 MG/2ML IJ SOLN
INTRAMUSCULAR | Status: DC | PRN
  Administered 2017-08-02: 2 mg via INTRAVENOUS

## 2017-08-02 MED ORDER — KETOROLAC TROMETHAMINE 30 MG/ML IJ SOLN: 30.0000 mg | Freq: Four times a day (QID) | INTRAMUSCULAR | Status: DC

## 2017-08-02 MED ORDER — PROPOFOL 10 MG/ML IV BOLUS
INTRAVENOUS | Status: AC
  Filled 2017-08-02: qty 20

## 2017-08-02 MED ORDER — MORPHINE SULFATE (PF) 4 MG/ML IV SOLN: 1.0000 mg | INTRAVENOUS | Status: DC | PRN

## 2017-08-02 MED ORDER — ZOLPIDEM TARTRATE 5 MG PO TABS
5.0000 mg | ORAL_TABLET | Freq: Every evening | ORAL | Status: DC | PRN
  Administered 2017-08-02: 5 mg via ORAL
  Filled 2017-08-02: qty 1

## 2017-08-02 MED ORDER — ONDANSETRON HCL 4 MG/2ML IJ SOLN
INTRAMUSCULAR | Status: DC | PRN
  Administered 2017-08-02: 4 mg via INTRAVENOUS

## 2017-08-02 MED ORDER — LIDOCAINE HCL 1 % IJ SOLN
INTRAMUSCULAR | Status: AC
  Filled 2017-08-02: qty 20

## 2017-08-02 MED ORDER — ACETAMINOPHEN 160 MG/5ML PO SOLN
975.0000 mg | Freq: Once | ORAL | Status: AC
  Administered 2017-08-02: 975 mg via ORAL

## 2017-08-02 MED ORDER — ROCURONIUM BROMIDE 100 MG/10ML IV SOLN
INTRAVENOUS | Status: DC | PRN
  Administered 2017-08-02: 40 mg via INTRAVENOUS
  Administered 2017-08-02: 10 mg via INTRAVENOUS

## 2017-08-02 MED ORDER — DEXAMETHASONE SODIUM PHOSPHATE 10 MG/ML IJ SOLN
INTRAMUSCULAR | Status: AC
Start: 2017-08-02 — End: ?
  Filled 2017-08-02: qty 1

## 2017-08-02 MED ORDER — LIDOCAINE HCL (CARDIAC) 20 MG/ML IV SOLN
INTRAVENOUS | Status: AC
  Filled 2017-08-02: qty 5

## 2017-08-02 MED ORDER — CEFOTETAN DISODIUM-DEXTROSE 2-2.08 GM-%(50ML) IV SOLR
2.0000 g | INTRAVENOUS | Status: AC
Start: 2017-08-02 — End: 2017-08-02
  Administered 2017-08-02: 2 g via INTRAVENOUS

## 2017-08-02 MED ORDER — LIDOCAINE-EPINEPHRINE 1 %-1:100000 IJ SOLN
INTRAMUSCULAR | Status: DC | PRN
Start: 2017-08-02 — End: 2017-08-02
  Administered 2017-08-02: 17 mL

## 2017-08-02 MED ORDER — KETOROLAC TROMETHAMINE 30 MG/ML IJ SOLN
30.0000 mg | Freq: Once | INTRAMUSCULAR | Status: AC
  Administered 2017-08-02: 30 mg via INTRAVENOUS

## 2017-08-02 MED ORDER — DEXTROSE-NACL 5-0.9 % IV SOLN: INTRAVENOUS | Status: DC

## 2017-08-02 SURGICAL SUPPLY — 59 items
APL SRG 38 LTWT LNG FL B (MISCELLANEOUS)
APPLICATOR ARISTA FLEXITIP XL (MISCELLANEOUS) ×3 IMPLANT
CABLE HIGH FREQUENCY MONO STRZ (ELECTRODE) IMPLANT
CANISTER SUCT 3000ML PPV (MISCELLANEOUS) ×6 IMPLANT
CLOTH BEACON ORANGE TIMEOUT ST (SAFETY) ×6 IMPLANT
CONT PATH 16OZ SNAP LID 3702 (MISCELLANEOUS) ×6 IMPLANT
COVER BACK TABLE 60X90IN (DRAPES) ×3 IMPLANT
COVER MAYO STAND STRL (DRAPES) ×3 IMPLANT
DECANTER SPIKE VIAL GLASS SM (MISCELLANEOUS) ×9 IMPLANT
DRSG OPSITE POSTOP 3X4 (GAUZE/BANDAGES/DRESSINGS) ×3 IMPLANT
DURAPREP 26ML APPLICATOR (WOUND CARE) ×6 IMPLANT
ELECT REM PT RETURN 9FT ADLT (ELECTROSURGICAL) ×6
ELECTRODE REM PT RTRN 9FT ADLT (ELECTROSURGICAL) ×4 IMPLANT
FILTER SMOKE EVAC LAPAROSHD (FILTER) ×3 IMPLANT
GLOVE BIO SURGEON STRL SZ 6.5 (GLOVE) ×5 IMPLANT
GLOVE BIO SURGEON STRL SZ7.5 (GLOVE) ×18 IMPLANT
GLOVE BIO SURGEONS STRL SZ 6.5 (GLOVE) ×1
GLOVE BIOGEL PI IND STRL 6.5 (GLOVE) ×4 IMPLANT
GLOVE BIOGEL PI IND STRL 7.0 (GLOVE) ×12 IMPLANT
GLOVE BIOGEL PI INDICATOR 6.5 (GLOVE) ×2
GLOVE BIOGEL PI INDICATOR 7.0 (GLOVE) ×6
GOWN STRL REUS W/TWL LRG LVL3 (GOWN DISPOSABLE) ×24 IMPLANT
HEMOSTAT ARISTA ABSORB 3G PWDR (MISCELLANEOUS) ×3 IMPLANT
LEGGING LITHOTOMY PAIR STRL (DRAPES) ×6 IMPLANT
NDL MAYO CATGUT SZ4 TPR NDL (NEEDLE) IMPLANT
NDL SPNL 18GX3.5 QUINCKE PK (NEEDLE) ×3 IMPLANT
NEEDLE MAYO CATGUT SZ4 (NEEDLE) IMPLANT
NEEDLE SPNL 18GX3.5 QUINCKE PK (NEEDLE) ×6 IMPLANT
NS IRRIG 1000ML POUR BTL (IV SOLUTION) ×6 IMPLANT
PACK LAVH (CUSTOM PROCEDURE TRAY) ×6 IMPLANT
PACK ROBOTIC GOWN (GOWN DISPOSABLE) ×6 IMPLANT
PACK TRENDGUARD 450 HYBRID PRO (MISCELLANEOUS) ×1 IMPLANT
PACK TRENDGUARD 600 HYBRD PROC (MISCELLANEOUS) IMPLANT
PACK VAGINAL WOMENS (CUSTOM PROCEDURE TRAY) ×6 IMPLANT
PAD OB MATERNITY 4.3X12.25 (PERSONAL CARE ITEMS) ×6 IMPLANT
POUCH LAPAROSCOPIC INSTRUMENT (MISCELLANEOUS) ×3 IMPLANT
PROTECTOR NERVE ULNAR (MISCELLANEOUS) ×6 IMPLANT
SCISSORS LAP 5X35 DISP (ENDOMECHANICALS) IMPLANT
SET CYSTO W/LG BORE CLAMP LF (SET/KITS/TRAYS/PACK) ×6 IMPLANT
SET IRRIG TUBING LAPAROSCOPIC (IRRIGATION / IRRIGATOR) ×3 IMPLANT
SHEARS HARMONIC ACE PLUS 36CM (ENDOMECHANICALS) ×3 IMPLANT
SHEET LAVH (DRAPES) ×3 IMPLANT
SLEEVE XCEL OPT CAN 5 100 (ENDOMECHANICALS) ×3 IMPLANT
SUT PLAIN 4 0 FS 2 27 (SUTURE) ×3 IMPLANT
SUT VIC AB 0 CT1 18XCR BRD8 (SUTURE) ×12 IMPLANT
SUT VIC AB 0 CT1 36 (SUTURE) ×6 IMPLANT
SUT VIC AB 0 CT1 8-18 (SUTURE) ×18
SUT VIC AB 2-0 SH 27 (SUTURE)
SUT VIC AB 2-0 SH 27XBRD (SUTURE) IMPLANT
SUT VICRYL 0 TIES 12 18 (SUTURE) ×6 IMPLANT
SUT VICRYL 0 UR6 27IN ABS (SUTURE) ×3 IMPLANT
SYR BULB IRRIGATION 50ML (SYRINGE) ×6 IMPLANT
TOWEL OR 17X24 6PK STRL BLUE (TOWEL DISPOSABLE) ×12 IMPLANT
TRAY FOLEY CATH SILVER 14FR (SET/KITS/TRAYS/PACK) ×6 IMPLANT
TRENDGUARD 450 HYBRID PRO PACK (MISCELLANEOUS) ×6
TRENDGUARD 600 HYBRID PROC PK (MISCELLANEOUS)
TROCAR XCEL NON-BLD 11X100MML (ENDOMECHANICALS) ×3 IMPLANT
TROCAR XCEL NON-BLD 5MMX100MML (ENDOMECHANICALS) ×3 IMPLANT
WARMER LAPAROSCOPE (MISCELLANEOUS) ×6 IMPLANT

## 2017-08-02 NOTE — Op Note (Signed)
Mackenzie Thomas September 09, 1966 [id number]   Post Operative Note   Date of surgery:  08/02/2017  Pre Op Dx: Persistent high-grade cervical dysplasia  Post Op Dx: Persistent high-grade cervical dysplasia  Procedure: Total vaginal hysterectomy bilateral salpingo-oophorectomy  Surgeon:  Dara Lordsimothy P Vertie Dibbern  Assistant:  Marcie BalLavoie, Marie-Lyn  Anesthesia:  General  EBL: 50 cc anesthesia reported  Complications:  None  Specimen: Uterus, right and left fallopian tubes, right and left ovaries to pathology  Findings: EUA: External BUS vagina normal.  Cervix normal.  Uterus normal size midline mobile.  Adnexa without masses   Operative: Uterus, bilateral fallopian tubes, bilateral ovaries grossly normal.  Cul-de-sac grossly normal to limited inspection without evidence of adhesions or pelvic endometriosis.   Cystoscopy: Adequate noting good bladder distention with no evidence of bladder mucosal injury and bilateral ureteral jets  Procedure: Patient was taken to the operating room, placed in the low dorsolithotomy position, underwent general anesthesia without difficulty and received abdominal, perineal and vaginal preparation by nursing personnel.  A Foley catheter was placed in sterile technique.  The timeout was performed by the surgical team.  The EUA was performed and the patient was draped in usual fashion.  Patient was placed in the high dorsal lithotomy position.  The cervix was visualized with a weighted speculum, grasped with a single-tooth tenaculum and the cervical mucosa was circumferentially injected using 1% lidocaine with 1-100,000 epinephrine dilution.  The cervical mucosa was then sharply incised and the paracervical planes were sharply and bluntly developed without difficulty.  The anterior cul-de-sac was entered without difficulty.  Subsequently the posterior cul-de-sac was entered without difficulty and a long weighted speculum was placed.  The right and left uterosacral ligaments were  identified clamped cut and ligated using 0 Vicryl suture and tagged for future reference.  The uterus was then freed from its attachments through clamping cutting and ligating of the paracervical/cardinal ligaments and parametrial tissues using 0 Vicryl suture.  The uterus was then delivered through the vagina and the uterine ovarian pedicles were clamped cut and ligated using 0 Vicryl suture and the uterus was removed from the patient.  A tagged tail sponge was then placed into the posterior cul-de-sac and the right fallopian tube segment and attached ovary were identified and the infundibulopelvic ligament/vessels were clamped and the ovary and fallopian tube were excised patient.  The pedicle was then doubly ligated using 0 Vicryl suture.  A similar procedure was carried out on the other side.  The tagged tail sponge was removed and the cul-de-sac irrigated showing adequate hemostasis.  A shorter weighted speculum was placed into the vagina.  The posterior vaginal cuff was run from uterosacral ligament to uterosacral ligament using 0 Vicryl suture in a running interlocking stitch.  The vagina was then closed anterior to posterior using 0 Vicryl suture in interrupted figure-of-eight stitch.  Attention was then turned to the cystoscopy and the Foley catheter was removed and cystoscopy was performed, the patient preoperatively receiving Pyridium.  There was good bladder distention, no evidence of bladder mucosal injury and bilateral ureteral jets.  The Foley catheter was replaced, the patient placed in the low dorsolithotomy position, the instrument/sponge/needle counts were verified correct and the specimen was identified for pathology.  The patient was awakened without difficulty and taken to recovery room in good condition having tolerated procedure well.     Dara Lordsimothy P [name] MD, 8:53 AM 08/02/2017

## 2017-08-02 NOTE — Anesthesia Procedure Notes (Signed)
Procedure Name: Intubation Date/Time: 08/02/2017 7:31 AM Performed by: Flossie Dibble, CRNA Pre-anesthesia Checklist: Patient identified, Patient being monitored, Timeout performed, Emergency Drugs available and Suction available Patient Re-evaluated:Patient Re-evaluated prior to induction Oxygen Delivery Method: Circle System Utilized and Circle system utilized Preoxygenation: Pre-oxygenation with 100% oxygen Induction Type: IV induction Ventilation: Mask ventilation without difficulty Laryngoscope Size: Mac and 3 Grade View: Grade II Tube type: Oral Tube size: 7.0 mm Number of attempts: 1 Airway Equipment and Method: stylet Placement Confirmation: ETT inserted through vocal cords under direct vision,  positive ETCO2 and breath sounds checked- equal and bilateral Secured at: 21.5 cm Tube secured with: Tape Dental Injury: Teeth and Oropharynx as per pre-operative assessment

## 2017-08-02 NOTE — H&P (Signed)
The patient was examined.  I reviewed the proposed surgery and consent form with the patient.  The dictated history and physical is current and accurate and all questions were answered. The patient is ready to proceed with surgery and has a realistic understanding and expectation for the outcome.   Dara Lordsimothy P Hans Rusher MD, 7:06 AM 08/02/2017

## 2017-08-02 NOTE — Anesthesia Postprocedure Evaluation (Signed)
Anesthesia Post Note  Patient: Mackenzie Thomas  Procedure(s) Performed: HYSTERECTOMY VAGINAL (N/A Vagina ) SALPINGO OOPHORECTOMY (Bilateral Vagina ) CYSTOSCOPY (N/A Bladder)     Patient location during evaluation: [facility] Anesthesia Type: General Level of consciousness: awake and alert and oriented Pain management: satisfactory to patient Vital Signs Assessment: post-procedure vital signs reviewed and stable Respiratory status: spontaneous breathing and respiratory function stable Cardiovascular status: stable Postop Assessment: adequate PO intake Anesthetic complications: no    Last Vitals:  Vitals:   08/02/17 1011 08/02/17 1121  BP: 124/83 110/78  Pulse: 73 85  Resp: 16 18  Temp: 36.8 C 36.7 C  SpO2: 97% 96%    Last Pain:  Vitals:   08/02/17 1540  TempSrc:   PainSc: 5    Pain Goal: Patients Stated Pain Goal: 3 (08/02/17 0603)               Karleen DolphinFUSSELL,Dyke Weible

## 2017-08-02 NOTE — Transfer of Care (Signed)
Immediate Anesthesia Transfer of Care Note  Patient: Edgar FriskStacey B Wernli  Procedure(s) Performed: HYSTERECTOMY VAGINAL (N/A Vagina ) SALPINGO OOPHORECTOMY (Bilateral Vagina ) CYSTOSCOPY (N/A Bladder)  Patient Location: PACU  Anesthesia Type:General  Level of Consciousness: awake, alert  and oriented  Airway & Oxygen Therapy: Patient Spontanous Breathing and Patient connected to nasal cannula oxygen  Post-op Assessment: Report given to RN and Post -op Vital signs reviewed and stable  Post vital signs: Reviewed and stable  Last Vitals:  Vitals:   08/02/17 0603  BP: 107/90  Pulse: 80  Resp: 16  Temp: 36.8 C    Last Pain:  Vitals:   08/02/17 0603  TempSrc: Oral      Patients Stated Pain Goal: 3 (08/02/17 0603)  Complications: No apparent anesthesia complications

## 2017-08-02 NOTE — Progress Notes (Signed)
Patient ID: Mackenzie FriskStacey B Thomas, female   DOB: 1965-12-12, 51 y.o.   MRN: [id number] Mackenzie FriskStacey B Thomas 1965-12-12 [id number]   Day of Surgery s/p Procedure(s): HYSTERECTOMY VAGINAL SALPINGO OOPHORECTOMY CYSTOSCOPY  Subjective: Patient reports feels well, no acute distress, pain severity reported mild, Yes.   taking PO, foley catheter out, Yes.   voiding, Yes.   ambulating, No. passing flatus  Objective: Vital signs in last 24 hours: Temp:  [98 F (36.7 C)-98.6 F (37 C)] 98 F (36.7 C) (11/20 1121) Pulse Rate:  [73-89] 85 (11/20 1121) Resp:  [12-18] 18 (11/20 1121) BP: (107-124)/(78-90) 110/78 (11/20 1121) SpO2:  [96 %-100 %] 96 % (11/20 1121) Weight:  [148 lb (67.1 kg)] 148 lb (67.1 kg) (11/20 1027) Last BM Date: (no bm since surgery)  EXAM General: awake, alert and no distress Resp: clear to auscultation bilaterally Cardio: regular rate and rhythm GI: soft, minimal tenderness, bowel sounds actove Lower Extremities: Without swelling or tenderness Vaginal Bleeding: scant  Assessment: s/p Procedure(s): HYSTERECTOMY VAGINAL SALPINGO OOPHORECTOMY CYSTOSCOPY: stable and progressing well  Plan: Continue routine post operative care Plan Discharge in AM  LOS: 0 days    Dara Lordsimothy P Ryszard Socarras MD, 5:14 PM 08/02/2017

## 2017-08-03 ENCOUNTER — Other Ambulatory Visit: Payer: Self-pay | Admitting: Gynecology

## 2017-08-03 DIAGNOSIS — D061 Carcinoma in situ of exocervix: Secondary | ICD-10-CM | POA: Diagnosis not present

## 2017-08-03 MED ORDER — ESTRADIOL 0.5 MG PO TABS: 0.5000 mg | ORAL_TABLET | Freq: Every day | ORAL | 11 refills | Status: DC

## 2017-08-03 MED ORDER — OXYCODONE-ACETAMINOPHEN 5-325 MG PO TABS: 1.0000 | ORAL_TABLET | ORAL | 0 refills | Status: DC | PRN

## 2017-08-03 MED ORDER — INFLUENZA VAC SPLIT QUAD 0.5 ML IM SUSY: 0.5000 mL | PREFILLED_SYRINGE | INTRAMUSCULAR | 0 refills | Status: AC

## 2017-08-03 NOTE — Progress Notes (Signed)
Discharge instructions given to pt. Went over post-op care and what s&s she should be looking out for to report to the physician. Pt verbalizes understanding and has no questions or concerns at this time. IV taken out and tolerated well. Pt being walked down in stable condition.

## 2017-08-03 NOTE — Discharge Instructions (Signed)
°  Postoperative Instructions Hysterectomy ° °Dr. Turner Kunzman and the nursing staff have discussed postoperative instructions with you.  If you have any questions please ask them before you leave the hospital, or call Dr Ghazal Pevey’s office at [phone].   ° °We would like to emphasize the following instructions: ° ° °  Call the office to make your follow-up appointment as recommended by Dr Leigh Kaeding (usually 2 weeks). ° °  You were given a prescription, or one was ordered for you at the pharmacy you designated.  Get that prescription filled and take the medication according to instructions. ° °  You may eat a regular diet, but slowly until you start having bowel movements. ° °  Drink plenty of water daily. ° °  Nothing in the vagina (intercourse, douching, objects of any kind) until released by Dr Sharone Picchi. ° °  No driving for two weeks.  Wait to be cleared by Dr Deandre Brannan at your first post op check.  Car rides (short) are ok after several days at home, as long as you are not having significant pain, but no traveling out of town. ° °  You may shower, but no baths.  Walking up and down stairs is ok.  No heavy lifting, prolonged standing, repeated bending or any “working out” until your first post op check. ° °  Rest frequently, listen to your body and do not push yourself and overdo it. ° °  Call if: ° °o Your pain medication does not seem strong enough. °o Worsening pain or abdominal bloating °o Persistent nausea or vomiting °o Difficulty with urination or bowel movements. °o Temperature of 101 degrees or higher. °o Bleeding heavier then staining (clots or period type flow). °o Incisions become red, tender or begin to drain. °o You have any questions or concerns. °

## 2017-08-03 NOTE — Plan of Care (Signed)
Pt up ambulating, voiding tolerating regular diet.  Pain under control.   VSS

## 2017-08-03 NOTE — Progress Notes (Signed)
Patient ID: Mackenzie Thomas, female   DOB: July 13, 1966, 51 y.o.   MRN: [id number] Mackenzie Thomas July 13, 1966 [id number]   1 Day Post-Op Procedure(s) (LRB): HYSTERECTOMY VAGINAL (N/A) SALPINGO OOPHORECTOMY (Bilateral) CYSTOSCOPY (N/A)  Subjective: Patient reports feels well, pain severity reported mild, Yes.   taking PO, foley catheter out, Yes.   voiding, Yes.   ambulating, Yes.   passing flatus  Objective: Vital signs in last 24 hours: Temp:  [98 F (36.7 C)-99.1 F (37.3 C)] 99.1 F (37.3 C) (11/21 0344) Pulse Rate:  [62-89] 69 (11/21 0344) Resp:  [12-18] 18 (11/21 0344) BP: (100-124)/(69-87) 100/71 (11/21 0344) SpO2:  [96 %-100 %] 97 % (11/21 0344) Weight:  [148 lb (67.1 kg)] 148 lb (67.1 kg) (11/20 1027) Last BM Date: (no bm since surgery)    EXAM General: awake, alert and no distress Resp: clear to auscultation bilaterally Cardio: regular rate and rhythm and regularly irregular rhythm GI: soft, non tender, bowel sounds active Lower Extremities: Without swelling or tenderness Vaginal Bleeding: Reported scant    Assessment: s/p Procedure(s): HYSTERECTOMY VAGINAL SALPINGO OOPHORECTOMY CYSTOSCOPY: progressing well, ready for discharge.    Plan: Discharge home today.  Precautions, instructions and follow up were discussed with the patient.  Prescriptions provided per AVS.  Patient to call the office to arrange a post-operative appointmant in 2 weeks.    Dara Lordsimothy P Fontaine MD, 7:25 AM 08/03/2017

## 2017-08-05 ENCOUNTER — Encounter (HOSPITAL_COMMUNITY): Payer: Self-pay | Admitting: Gynecology

## 2017-08-09 ENCOUNTER — Telehealth: Payer: Self-pay | Admitting: *Deleted

## 2017-08-09 MED ORDER — IBUPROFEN 800 MG PO TABS: 800.0000 mg | ORAL_TABLET | Freq: Three times a day (TID) | ORAL | 0 refills | Status: AC | PRN

## 2017-08-09 MED ORDER — TRAMADOL HCL 50 MG PO TABS: 50.0000 mg | ORAL_TABLET | Freq: Four times a day (QID) | ORAL | 0 refills | Status: DC | PRN

## 2017-08-09 NOTE — Telephone Encounter (Signed)
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  She said the pain is pelvic pressure, no urinary symptoms, no fever. Patient did mention she had 1 drop of blood yesterday, but believes she did too much walking in [employer] with her mother. Pt has not had any further bleeding. Please advise

## 2017-08-09 NOTE — Telephone Encounter (Signed)
Patient aware of all the below, #1 done, pt informed with # 2 as well. Ultram called in, motrin sent.

## 2017-08-09 NOTE — Telephone Encounter (Signed)
Options are: 1.  Try Ultram 50 mg every 6 hours as needed for pain #30.  Also be taking ibuprofen 800 mg every 8 hours around-the-clock for the next several days just to help with the background pain.  If she does not have this then also to prescribe #60.  Rest and stay off her feet over the next day or 2.  If any issues continue then office visit. 2.  Office visit now and I would be more than happy to see her if she has any concerns.  It sounds like she overdid it and that hopefully just resting for a day or so will relieve the discomfort.

## 2017-08-16 ENCOUNTER — Ambulatory Visit (INDEPENDENT_AMBULATORY_CARE_PROVIDER_SITE_OTHER): Payer: 59 | Admitting: Gynecology

## 2017-08-16 ENCOUNTER — Encounter: Payer: Self-pay | Admitting: Gynecology

## 2017-08-16 VITALS — BP 114/70

## 2017-08-16 DIAGNOSIS — Z9889 Other specified postprocedural states: Secondary | ICD-10-CM

## 2017-08-16 NOTE — Patient Instructions (Signed)
Follow-up in 2 weeks for your next postoperative visit. 

## 2017-08-16 NOTE — Progress Notes (Signed)
    Edgar FriskStacey B Nath 12-08-1965 [id number]        51 y.o.  [id number] presents for her 2-week visit status post TVH BSO for persistent high-grade dysplasia.  Has done well without complaints.  On estradiol 0.5 mg daily without symptoms of hot flushes or sweats.  Past medical history,surgical history, problem list, medications, allergies, family history and social history were all reviewed and documented in the EPIC chart.  Directed ROS with pertinent positives and negatives documented in the history of present illness/assessment and plan.  Exam: Kennon PortelaKim Gardner assistant Vitals:   08/16/17 1408  BP: 114/70   General appearance:  Normal Abdomen soft nontender without masses guarding rebound Pelvic external BUS vagina normal with cuff healing nicely.  Bimanual exam without tenderness  Assessment/Plan:  51 y.o. [id number] with normal postoperative visit status post TVH BSO.  Will continue on her estradiol 0.5 mg daily.  Will slowly resume normal activities with the exception of pelvic rest.  We will follow-up in 2 weeks for her next postoperative visit.    Dara Lordsimothy P Keana Dueitt MD, 2:24 PM 08/16/2017

## 2017-08-30 ENCOUNTER — Encounter: Payer: Self-pay | Admitting: Gynecology

## 2017-08-30 ENCOUNTER — Ambulatory Visit (INDEPENDENT_AMBULATORY_CARE_PROVIDER_SITE_OTHER): Payer: 59 | Admitting: Gynecology

## 2017-08-30 VITALS — BP 118/76

## 2017-08-30 DIAGNOSIS — Z9889 Other specified postprocedural states: Secondary | ICD-10-CM

## 2017-08-30 NOTE — Progress Notes (Signed)
    Mackenzie FriskStacey B Thomas 08-25-1966 [id number]        51 y.o.  [id number] presents for her 4-week postoperative visit status post TVH BSO.  On estradiol 0.5 mg daily.  Doing well without menopausal symptoms.  Past medical history,surgical history, problem list, medications, allergies, family history and social history were all reviewed and documented in the EPIC chart.  Directed ROS with pertinent positives and negatives documented in the history of present illness/assessment and plan.  Exam: Mackenzie PortelaKim Thomas assistant Vitals:   08/30/17 0952  BP: 118/76   General appearance:  Normal Abdomen soft nontender without masses guarding rebound Pelvic external BUS vagina with cuff healing nicely.  Bimanual without mass or tenderness.  Assessment/Plan:  51 y.o. [id number] with normal postoperative visit at 4 weeks status post TVH BSO.  Will slowly resume all normal activities with the exception of continue pelvic rest for another 4 weeks.  We will follow-up in several months for her annual exam.  Will call sooner if any issues.  She is going to continue on her estradiol 0.5 mg daily.    Dara Lordsimothy P Petr Bontempo MD, 10:13 AM 08/30/2017

## 2017-08-30 NOTE — Patient Instructions (Signed)
Continue with nothing in the vagina for another month.  Slowly resume other activities.  Follow-up in several months for your annual exam, sooner if any issues.

## 2017-10-14 ENCOUNTER — Other Ambulatory Visit: Payer: Self-pay | Admitting: Women's Health

## 2017-10-14 ENCOUNTER — Other Ambulatory Visit: Payer: Self-pay | Admitting: Gynecology

## 2017-10-14 DIAGNOSIS — F419 Anxiety disorder, unspecified: Secondary | ICD-10-CM

## 2017-10-17 ENCOUNTER — Encounter: Payer: Self-pay | Admitting: Gynecology

## 2017-10-17 ENCOUNTER — Ambulatory Visit (INDEPENDENT_AMBULATORY_CARE_PROVIDER_SITE_OTHER): Payer: No Typology Code available for payment source | Admitting: Gynecology

## 2017-10-17 VITALS — BP 112/76

## 2017-10-17 DIAGNOSIS — N941 Unspecified dyspareunia: Secondary | ICD-10-CM | POA: Diagnosis not present

## 2017-10-17 NOTE — Telephone Encounter (Signed)
Okay for refill?  

## 2017-10-17 NOTE — Progress Notes (Signed)
    Mackenzie Thomas 06/11/1966 [id number]        52 y.o.  G2P2002 presents having undergone TVH BSO for high-grade dysplasia 08/02/2017.  Currently on estradiol 0.5 mg.  Notes with intercourse it feels as if her husband is hitting one specific area of the causes a lot of discomfort.  Not having pelvic discomfort otherwise.  No urinary or bowel symptoms.  No vaginal bleeding.  Past medical history,surgical history, problem list, medications, allergies, family history and social history were all reviewed and documented in the EPIC chart.  Directed ROS with pertinent positives and negatives documented in the history of present illness/assessment and plan.  Exam: Kennon PortelaKim Gardner assistant Vitals:   10/17/17 1218  BP: 112/76   General appearance:  Normal Abdomen soft nontender without masses guarding rebound Pelvic external BUS vagina with area of granulation tissue mid cuff region.  Exam otherwise normal.  Bimanual without masses.  Tender when I palpate over this area.    Silver nitrate was applied to the granulation tissue.  Patient tolerated well.  Assessment/Plan:  52 y.o. [id number] with area of granulation tissue following hysterectomy causing discomfort with intercourse.  Silver nitrate applied.  Pelvic rest for a week or so.  Patient will follow-up if this continues to be an issue.  Otherwise will follow-up when she is due for her annual exam.    Dara Lordsimothy P Deontaye Civello MD, 12:33 PM 10/17/2017

## 2017-10-17 NOTE — Patient Instructions (Signed)
Follow-up if pain continues to be an issue.

## 2017-10-17 NOTE — Telephone Encounter (Signed)
Rx called in 

## 2017-11-18 ENCOUNTER — Other Ambulatory Visit: Payer: Self-pay | Admitting: Women's Health

## 2017-11-18 DIAGNOSIS — F419 Anxiety disorder, unspecified: Secondary | ICD-10-CM

## 2017-11-18 NOTE — Telephone Encounter (Signed)
Recall for CE is in system as 08/2018.

## 2017-11-18 NOTE — Telephone Encounter (Signed)
Mackenzie CrapeClaudia will call patient and schedule annual exam.

## 2017-11-18 NOTE — Telephone Encounter (Signed)
Ok for refill  - Please call and review needs annual, had TVH in November

## 2017-12-14 ENCOUNTER — Other Ambulatory Visit: Payer: Self-pay

## 2017-12-14 DIAGNOSIS — F5101 Primary insomnia: Secondary | ICD-10-CM

## 2017-12-14 MED ORDER — ZOLPIDEM TARTRATE 10 MG PO TABS: 10.0000 mg | ORAL_TABLET | Freq: Every evening | ORAL | 1 refills | Status: DC | PRN

## 2017-12-14 NOTE — Telephone Encounter (Signed)
Overdue for annual exam.  Last CE 10/12/16. Overdue. Scheduled for CE on 02/15/18 with you.

## 2017-12-14 NOTE — Telephone Encounter (Signed)
Called into pharmacy

## 2017-12-14 NOTE — Telephone Encounter (Signed)
Okay for refill?  

## 2017-12-29 ENCOUNTER — Other Ambulatory Visit: Payer: Self-pay | Admitting: Women's Health

## 2017-12-29 DIAGNOSIS — F419 Anxiety disorder, unspecified: Secondary | ICD-10-CM

## 2018-01-02 NOTE — Telephone Encounter (Signed)
Rx called in 

## 2018-01-02 NOTE — Telephone Encounter (Signed)
Ok for refill? 

## 2018-01-02 NOTE — Telephone Encounter (Signed)
Annual scheduled on 02/15/18

## 2018-02-15 ENCOUNTER — Encounter: Payer: No Typology Code available for payment source | Admitting: Women's Health

## 2018-02-15 DIAGNOSIS — Z0289 Encounter for other administrative examinations: Secondary | ICD-10-CM

## 2018-02-21 ENCOUNTER — Encounter: Payer: Self-pay | Admitting: Gynecology

## 2018-03-21 ENCOUNTER — Ambulatory Visit (INDEPENDENT_AMBULATORY_CARE_PROVIDER_SITE_OTHER): Payer: No Typology Code available for payment source | Admitting: Women's Health

## 2018-03-21 ENCOUNTER — Encounter: Payer: Self-pay | Admitting: Women's Health

## 2018-03-21 DIAGNOSIS — R5383 Other fatigue: Secondary | ICD-10-CM | POA: Diagnosis not present

## 2018-03-21 LAB — CBC WITH DIFFERENTIAL/PLATELET
Basophils Absolute: 40 cells/uL (ref 0–200)
Basophils Relative: 0.6 %
EOS ABS: 302 {cells}/uL (ref 15–500)
EOS PCT: 4.5 %
HEMATOCRIT: 37.7 % (ref 35.0–45.0)
Hemoglobin: 12.9 g/dL (ref 11.7–15.5)
LYMPHS ABS: 1735 {cells}/uL (ref 850–3900)
MCH: 31 pg (ref 27.0–33.0)
MCHC: 34.2 g/dL (ref 32.0–36.0)
MCV: 90.6 fL (ref 80.0–100.0)
MPV: 10.3 fL (ref 7.5–12.5)
Monocytes Relative: 7.5 %
NEUTROS ABS: 4121 {cells}/uL (ref 1500–7800)
Neutrophils Relative %: 61.5 %
PLATELETS: 264 10*3/uL (ref 140–400)
RBC: 4.16 10*6/uL (ref 3.80–5.10)
RDW: 12.2 % (ref 11.0–15.0)
Total Lymphocyte: 25.9 %
WBC mixed population: 503 cells/uL (ref 200–950)
WBC: 6.7 10*3/uL (ref 3.8–10.8)

## 2018-03-21 NOTE — Patient Instructions (Signed)
Dr Jacolyn ReedyGessner  [facility]  [phone]   Fatigue Fatigue is feeling tired all of the time, a lack of energy, or a lack of motivation. Occasional or mild fatigue is often a normal response to activity or life in general. However, long-lasting (chronic) or extreme fatigue may indicate an underlying medical condition. Follow these instructions at home: Watch your fatigue for any changes. The following actions may help to lessen any discomfort you are feeling:  Talk to your health care provider about how much sleep you need each night. Try to get the required amount every night.  Take medicines only as directed by your health care provider.  Eat a healthy and nutritious diet. Ask your health care provider if you need help changing your diet.  Drink enough fluid to keep your urine clear or pale yellow.  Practice ways of relaxing, such as yoga, meditation, massage therapy, or acupuncture.  Exercise regularly.  Change situations that cause you stress. Try to keep your work and personal routine reasonable.  Do not abuse illegal drugs.  Limit alcohol intake to no more than 1 drink per day for nonpregnant women and 2 drinks per day for men. One drink equals 12 ounces of beer, 5 ounces of wine, or 1 ounces of hard liquor.  Take a multivitamin, if directed by your health care provider.  Contact a health care provider if:  Your fatigue does not get better.  You have a fever.  You have unintentional weight loss or gain.  You have headaches.  You have difficulty: ? Falling asleep. ? Sleeping throughout the night.  You feel angry, guilty, anxious, or sad.  You are unable to have a bowel movement (constipation).  You skin is dry.  Your legs or another part of your body is swollen. Get help right away if:  You feel confused.  Your vision is blurry.  You feel faint or pass out.  You have a severe headache.  You have severe abdominal, pelvic, or back pain.  You have chest pain,  shortness of breath, or an irregular or fast heartbeat.  You are unable to urinate or you urinate less than normal.  You develop abnormal bleeding, such as bleeding from the rectum, vagina, nose, lungs, or nipples.  You vomit blood.  You have thoughts about harming yourself or committing suicide.  You are worried that you might harm someone else. This information is not intended to replace advice given to you by your health care provider. Make sure you discuss any questions you have with your health care provider. Document Released: 06/27/2007 Document Revised: 02/05/2016 Document Reviewed: 01/01/2014 Elsevier Interactive Patient Education  [employer]2018 [employer] Inc[employer]

## 2018-03-21 NOTE — Progress Notes (Signed)
52 year old D WF G2, P2 presents with complaint of increased fatigue to the point of needing a nap many afternoons, forgetfulness, dry skin and feels more moody.  07/2017 TVH with BSO on estradiol 0.5 daily.  Denies hot flushes and reports sleeps well at night, needs an alarm to wake up in the morning. Currently on Celexa 40 mg and states when she misses a dose she does feel increased anxiety and would like to continue.  Works from home, [place] type 1 diabetes doing well going to [employer] in the fall.  [name] 16 also doing well.  Long-term relationship with boyfriend is good.  Minimal alcohol, no change in diet, regular exercise.    Exam: Appears well.  Color good, appropriate responses, no difficulty with recall.  Fatigue  Plan: Continue regular sleep hours and healthy routine.  Options to decrease Celexa or change states would prefer to continue since has done well on.  Encouraged to decrease simple carbs at lunch, brisk walk or short 5-minute  exercise throughout the day.  CBC, CMP, vitamin D, TSH.  Options to change p.o. estrogen to a patch were reviewed declines states does like a pill form better.

## 2018-03-22 ENCOUNTER — Encounter (INDEPENDENT_AMBULATORY_CARE_PROVIDER_SITE_OTHER): Payer: Self-pay

## 2018-03-22 LAB — COMPREHENSIVE METABOLIC PANEL
AG RATIO: 1.3 (calc) (ref 1.0–2.5)
ALBUMIN MSPROF: 4.2 g/dL (ref 3.6–5.1)
ALT: 14 U/L (ref 6–29)
AST: 17 U/L (ref 10–35)
Alkaline phosphatase (APISO): 60 U/L (ref 33–130)
BILIRUBIN TOTAL: 0.2 mg/dL (ref 0.2–1.2)
BUN: 14 mg/dL (ref 7–25)
CALCIUM: 9.6 mg/dL (ref 8.6–10.4)
CO2: 27 mmol/L (ref 20–32)
Chloride: 102 mmol/L (ref 98–110)
Creat: 0.76 mg/dL (ref 0.50–1.05)
GLUCOSE: 108 mg/dL — AB (ref 65–99)
Globulin: 3.3 g/dL (calc) (ref 1.9–3.7)
POTASSIUM: 4.2 mmol/L (ref 3.5–5.3)
SODIUM: 136 mmol/L (ref 135–146)
TOTAL PROTEIN: 7.5 g/dL (ref 6.1–8.1)

## 2018-03-22 LAB — VITAMIN D 25 HYDROXY (VIT D DEFICIENCY, FRACTURES): VIT D 25 HYDROXY: 33 ng/mL (ref 30–100)

## 2018-03-22 LAB — TSH: TSH: 1.76 mIU/L

## 2018-03-31 ENCOUNTER — Other Ambulatory Visit: Payer: Self-pay | Admitting: Women's Health

## 2018-03-31 DIAGNOSIS — F419 Anxiety disorder, unspecified: Secondary | ICD-10-CM

## 2018-04-25 ENCOUNTER — Encounter: Payer: Self-pay | Admitting: *Deleted

## 2018-04-25 ENCOUNTER — Telehealth: Payer: Self-pay | Admitting: *Deleted

## 2018-04-25 DIAGNOSIS — F419 Anxiety disorder, unspecified: Secondary | ICD-10-CM

## 2018-04-25 MED ORDER — CITALOPRAM HYDROBROMIDE 40 MG PO TABS: ORAL_TABLET | ORAL | 0 refills | Status: DC

## 2018-04-25 NOTE — Telephone Encounter (Signed)
Annual exam scheduled on 06/14/18 needs refill on Celexa 40 mg okay to refill?

## 2018-04-25 NOTE — Telephone Encounter (Signed)
Appointment scheduled for October 2 with Maryelizabeth RowanNancy Young.

## 2018-04-25 NOTE — Telephone Encounter (Signed)
Rx sent, my chart message informing patient this has been done.

## 2018-04-25 NOTE — Telephone Encounter (Signed)
Ok for refill? 

## 2018-04-25 NOTE — Telephone Encounter (Signed)
Patient is overdue for annual exam, requesting refill on Celexa 40 mg tablet. Will have appointment desk call to schedule then [facility].

## 2018-05-03 ENCOUNTER — Telehealth: Payer: Self-pay | Admitting: *Deleted

## 2018-05-03 NOTE — Telephone Encounter (Signed)
patient called and left message in voicemail asking me to call, called and received voicemail. I left message to call me.

## 2018-05-08 ENCOUNTER — Encounter: Payer: Self-pay | Admitting: Gastroenterology

## 2018-05-08 ENCOUNTER — Encounter: Payer: Self-pay | Admitting: *Deleted

## 2018-05-10 NOTE — Telephone Encounter (Signed)
My chart message sent patient wanted name of [facility].

## 2018-05-30 ENCOUNTER — Encounter: Payer: Self-pay | Admitting: Gynecology

## 2018-05-31 ENCOUNTER — Other Ambulatory Visit: Payer: Self-pay | Admitting: Gynecology

## 2018-05-31 ENCOUNTER — Other Ambulatory Visit: Payer: Self-pay | Admitting: Women's Health

## 2018-05-31 DIAGNOSIS — F419 Anxiety disorder, unspecified: Secondary | ICD-10-CM

## 2018-05-31 MED ORDER — ESTRADIOL 1 MG PO TABS: 1.0000 mg | ORAL_TABLET | Freq: Every day | ORAL | 1 refills | Status: DC

## 2018-05-31 NOTE — Telephone Encounter (Signed)
Called into pharmacy

## 2018-05-31 NOTE — Telephone Encounter (Signed)
Weight gain is a common problem we see in the 50s.  When people have looked at female hormone levels and weight there does not appear to be a correlation and it probably is more age-related than "hormone" related.  It is difficult for me to be involved with decision-making when the patient is seeing another practitioner but looking at the blood work her estrogen level is at the lower end of the normal range and one option would be to increase her estradiol to 1 mg daily instead of 0.5 mg.  We can then see how she feels.  I am not a big fan of pellet medications because difficult to stop them once they are in place if the patient is having an issue.

## 2018-05-31 NOTE — Telephone Encounter (Signed)
Now that may be hormone related.  Start on the 1 mg estradiol daily and give it 30 days and we will see how she feels.  Call me if there is an ongoing issue.

## 2018-05-31 NOTE — Telephone Encounter (Signed)
Ok for refill? 

## 2018-05-31 NOTE — Telephone Encounter (Signed)
CE is scheduled 06/14/18 with [place].

## 2018-06-14 ENCOUNTER — Ambulatory Visit (INDEPENDENT_AMBULATORY_CARE_PROVIDER_SITE_OTHER): Payer: No Typology Code available for payment source | Admitting: Women's Health

## 2018-06-14 ENCOUNTER — Encounter: Payer: Self-pay | Admitting: Women's Health

## 2018-06-14 VITALS — BP 118/78 | Ht 64.0 in | Wt 156.0 lb

## 2018-06-14 DIAGNOSIS — F5101 Primary insomnia: Secondary | ICD-10-CM

## 2018-06-14 DIAGNOSIS — Z01419 Encounter for gynecological examination (general) (routine) without abnormal findings: Secondary | ICD-10-CM | POA: Diagnosis not present

## 2018-06-14 DIAGNOSIS — F419 Anxiety disorder, unspecified: Secondary | ICD-10-CM

## 2018-06-14 MED ORDER — ALPRAZOLAM 0.5 MG PO TABS: 0.5000 mg | ORAL_TABLET | Freq: Every day | ORAL | 1 refills | Status: DC | PRN

## 2018-06-14 MED ORDER — ZOLPIDEM TARTRATE 10 MG PO TABS: 10.0000 mg | ORAL_TABLET | Freq: Every evening | ORAL | 1 refills | Status: DC | PRN

## 2018-06-14 MED ORDER — NYSTATIN-TRIAMCINOLONE 100000-0.1 UNIT/GM-% EX OINT: 1.0000 "application " | TOPICAL_OINTMENT | Freq: Two times a day (BID) | CUTANEOUS | 1 refills | Status: DC

## 2018-06-14 MED ORDER — ESTRADIOL 1 MG PO TABS: 1.0000 mg | ORAL_TABLET | Freq: Every day | ORAL | 4 refills | Status: DC

## 2018-06-14 MED ORDER — CITALOPRAM HYDROBROMIDE 40 MG PO TABS: 40.0000 mg | ORAL_TABLET | Freq: Every day | ORAL | 4 refills | Status: DC

## 2018-06-14 NOTE — Addendum Note (Signed)
Addended by: Demetria Pore A on: 06/14/2018 10:30 AM   Modules accepted: Orders

## 2018-06-14 NOTE — Progress Notes (Signed)
Mackenzie Thomas 02-18-1966 [id number]    History:    Presents for annual exam.  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  Has had problems with pruritus,  eczema type rash since hysterectomy, has seen a dermatologist minimal relief with elocon.  Has patches behind neck, lateral breasts,  under breasts, low abdomen, lateral hips.  Normal mammogram history.  Screening colonoscopy scheduled 06/28/2018.  On Celexa daily with good relief of anxiety symptoms, uses rare Xanax and Ambien aware of addictive properties.  Past medical history, past surgical history, family history and social history were all reviewed and documented in the EPIC chart.  Engaged in July wedding not scheduled for several years.  Mackenzie Thomas 16 doing well, Mackenzie Thomas  type I diabetic 20 in college doing well.  ROS:  A ROS was performed and pertinent positives and negatives are included.  Exam:  Vitals:   06/14/18 0926  BP: 118/78  Weight: 156 lb (70.8 kg)  Height: 5\' 4"  (1.626 m)   Body mass index is 26.78 kg/m.   General appearance:  Normal Thyroid:  Symmetrical, normal in size, without palpable masses or nodularity. Respiratory  Auscultation:  Clear without wheezing or rhonchi Cardiovascular  Auscultation:  Regular rate, without rubs, murmurs or gallops  Edema/varicosities:  Not grossly evident Abdominal  Soft,nontender, without masses, guarding or rebound.  Liver/spleen:  No organomegaly noted  Hernia:  None appreciated  Skin  Inspection:  Grossly normal with numerous dry itchy patches on trunk of body   Breasts: Examined lying and sitting.     Right: Without masses, retractions, discharge or axillary adenopathy.     Left: Without masses, retractions, discharge or axillary adenopathy. Gentitourinary   Inguinal/mons:  Normal without inguinal adenopathy  External genitalia:  Normal  BUS/Urethra/Skene's glands:  Normal  Vagina:  Normal  Cervix: And uterus absent  Adnexa/parametria:      Rt: Without masses or tenderness.   Lt: Without masses or tenderness.  Anus and perineum: Normal  Digital rectal exam: Normal sphincter tone without palpated masses or tenderness  Assessment/Plan:  52 y.o. D WF G2, P2 for annual exam with complaint of dry itchy patches on trunk of body.  07/2017 TVH with BSO on estradiol for severe dysplasia Anxiety stable on Celexa Eczema  Plan: Estradiol 1 mg p.o. daily prescription refill given, reviewed slight risks of blood clots, strokes and breast cancer.  Continue annual screening mammogram, SBE's, calcium rich foods, vitamin D 2000 daily.  Labs at primary care.  Keep scheduled appointment for screening colonoscopy.  Prescription for Mycolog twice daily, follow-up with dermatologist.  Benadryl at at bedtime to help with sleep and itching.  Celexa 40 mg p.o. daily refill given.  Refill of Xanax .25 mg and Ambien 10 mg, states she uses one prescription every 3 to 4 months aware of addictive properties.  Pap.    Harrington Challenger [facility], 10:12 AM 06/14/2018

## 2018-06-14 NOTE — Patient Instructions (Signed)
Health Maintenance for Postmenopausal Women Menopause is a normal process in which your reproductive ability comes to an end. This process happens gradually over a span of months to years, usually between the ages of 22 and 9. Menopause is complete when you have missed 12 consecutive menstrual periods. It is important to talk with your health care provider about some of the most common conditions that affect postmenopausal women, such as heart disease, cancer, and bone loss (osteoporosis). Adopting a healthy lifestyle and getting preventive care can help to promote your health and wellness. Those actions can also lower your chances of developing some of these common conditions. What should I know about menopause? During menopause, you may experience a number of symptoms, such as:  Moderate-to-severe hot flashes.  Night sweats.  Decrease in sex drive.  Mood swings.  Headaches.  Tiredness.  Irritability.  Memory problems.  Insomnia.  Choosing to treat or not to treat menopausal changes is an individual decision that you make with your health care provider. What should I know about hormone replacement therapy and supplements? Hormone therapy products are effective for treating symptoms that are associated with menopause, such as hot flashes and night sweats. Hormone replacement carries certain risks, especially as you become older. If you are thinking about using estrogen or estrogen with progestin treatments, discuss the benefits and risks with your health care provider. What should I know about heart disease and stroke? Heart disease, heart attack, and stroke become more likely as you age. This may be due, in part, to the hormonal changes that your body experiences during menopause. These can affect how your body processes dietary fats, triglycerides, and cholesterol. Heart attack and stroke are both medical emergencies. There are many things that you can do to help prevent heart disease  and stroke:  Have your blood pressure checked at least every 1-2 years. High blood pressure causes heart disease and increases the risk of stroke.  If you are 53-22 years old, ask your health care provider if you should take aspirin to prevent a heart attack or a stroke.  Do not use any tobacco products, including cigarettes, chewing tobacco, or electronic cigarettes. If you need help quitting, ask your health care provider.  It is important to eat a healthy diet and maintain a healthy weight. ? Be sure to include plenty of vegetables, fruits, low-fat dairy products, and lean protein. ? Avoid eating foods that are high in solid fats, added sugars, or salt (sodium).  Get regular exercise. This is one of the most important things that you can do for your health. ? Try to exercise for at least 150 minutes each week. The type of exercise that you do should increase your heart rate and make you sweat. This is known as moderate-intensity exercise. ? Try to do strengthening exercises at least twice each week. Do these in addition to the moderate-intensity exercise.  Know your numbers.Ask your health care provider to check your cholesterol and your blood glucose. Continue to have your blood tested as directed by your health care provider.  What should I know about cancer screening? There are several types of cancer. Take the following steps to reduce your risk and to catch any cancer development as early as possible. Breast Cancer  Practice breast self-awareness. ? This means understanding how your breasts normally appear and feel. ? It also means doing regular breast self-exams. Let your health care provider know about any changes, no matter how small.  If you are 40  or older, have a clinician do a breast exam (clinical breast exam or CBE) every year. Depending on your age, family history, and medical history, it may be recommended that you also have a yearly breast X-ray (mammogram).  If you  have a family history of breast cancer, talk with your health care provider about genetic screening.  If you are at high risk for breast cancer, talk with your health care provider about having an MRI and a mammogram every year.  Breast cancer (BRCA) gene test is recommended for women who have family members with BRCA-related cancers. Results of the assessment will determine the need for genetic counseling and BRCA1 and for BRCA2 testing. BRCA-related cancers include these types: ? Breast. This occurs in males or females. ? Ovarian. ? Tubal. This may also be called fallopian tube cancer. ? Cancer of the abdominal or pelvic lining (peritoneal cancer). ? Prostate. ? Pancreatic.  Cervical, Uterine, and Ovarian Cancer Your health care provider may recommend that you be screened regularly for cancer of the pelvic organs. These include your ovaries, uterus, and vagina. This screening involves a pelvic exam, which includes checking for microscopic changes to the surface of your cervix (Pap test).  For women ages 21-65, health care providers may recommend a pelvic exam and a Pap test every three years. For women ages 79-65, they may recommend the Pap test and pelvic exam, combined with testing for human papilloma virus (HPV), every five years. Some types of HPV increase your risk of cervical cancer. Testing for HPV may also be done on women of any age who have unclear Pap test results.  Other health care providers may not recommend any screening for nonpregnant women who are considered low risk for pelvic cancer and have no symptoms. Ask your health care provider if a screening pelvic exam is right for you.  If you have had past treatment for cervical cancer or a condition that could lead to cancer, you need Pap tests and screening for cancer for at least 20 years after your treatment. If Pap tests have been discontinued for you, your risk factors (such as having a new sexual partner) need to be  reassessed to determine if you should start having screenings again. Some women have medical problems that increase the chance of getting cervical cancer. In these cases, your health care provider may recommend that you have screening and Pap tests more often.  If you have a family history of uterine cancer or ovarian cancer, talk with your health care provider about genetic screening.  If you have vaginal bleeding after reaching menopause, tell your health care provider.  There are currently no reliable tests available to screen for ovarian cancer.  Lung Cancer Lung cancer screening is recommended for adults 69-62 years old who are at high risk for lung cancer because of a history of smoking. A yearly low-dose CT scan of the lungs is recommended if you:  Currently smoke.  Have a history of at least 30 pack-years of smoking and you currently smoke or have quit within the past 15 years. A pack-year is smoking an average of one pack of cigarettes per day for one year.  Yearly screening should:  Continue until it has been 15 years since you quit.  Stop if you develop a health problem that would prevent you from having lung cancer treatment.  Colorectal Cancer  This type of cancer can be detected and can often be prevented.  Routine colorectal cancer screening usually begins at  age 42 and continues through age 45.  If you have risk factors for colon cancer, your health care provider may recommend that you be screened at an earlier age.  If you have a family history of colorectal cancer, talk with your health care provider about genetic screening.  Your health care provider may also recommend using home test kits to check for hidden blood in your stool.  A small camera at the end of a tube can be used to examine your colon directly (sigmoidoscopy or colonoscopy). This is done to check for the earliest forms of colorectal cancer.  Direct examination of the colon should be repeated every  5-10 years until age 71. However, if early forms of precancerous polyps or small growths are found or if you have a family history or genetic risk for colorectal cancer, you may need to be screened more often.  Skin Cancer  Check your skin from head to toe regularly.  Monitor any moles. Be sure to tell your health care provider: ? About any new moles or changes in moles, especially if there is a change in a mole's shape or color. ? If you have a mole that is larger than the size of a pencil eraser.  If any of your family members has a history of skin cancer, especially at a Samantha Ragen age, talk with your health care provider about genetic screening.  Always use sunscreen. Apply sunscreen liberally and repeatedly throughout the day.  Whenever you are outside, protect yourself by wearing long sleeves, pants, a wide-brimmed hat, and sunglasses.  What should I know about osteoporosis? Osteoporosis is a condition in which bone destruction happens more quickly than new bone creation. After menopause, you may be at an increased risk for osteoporosis. To help prevent osteoporosis or the bone fractures that can happen because of osteoporosis, the following is recommended:  If you are 46-71 years old, get at least 1,000 mg of calcium and at least 600 mg of vitamin D per day.  If you are older than age 55 but younger than age 65, get at least 1,200 mg of calcium and at least 600 mg of vitamin D per day.  If you are older than age 54, get at least 1,200 mg of calcium and at least 800 mg of vitamin D per day.  Smoking and excessive alcohol intake increase the risk of osteoporosis. Eat foods that are rich in calcium and vitamin D, and do weight-bearing exercises several times each week as directed by your health care provider. What should I know about how menopause affects my mental health? Depression may occur at any age, but it is more common as you become older. Common symptoms of depression  include:  Low or sad mood.  Changes in sleep patterns.  Changes in appetite or eating patterns.  Feeling an overall lack of motivation or enjoyment of activities that you previously enjoyed.  Frequent crying spells.  Talk with your health care provider if you think that you are experiencing depression. What should I know about immunizations? It is important that you get and maintain your immunizations. These include:  Tetanus, diphtheria, and pertussis (Tdap) booster vaccine.  Influenza every year before the flu season begins.  Pneumonia vaccine.  Shingles vaccine.  Your health care provider may also recommend other immunizations. This information is not intended to replace advice given to you by your health care provider. Make sure you discuss any questions you have with your health care provider. Document Released: 10/22/2005  Document Revised: 03/19/2016 Document Reviewed: 06/03/2015 Elsevier Interactive Patient Education  2018 [employer]  

## 2018-06-15 ENCOUNTER — Telehealth: Payer: Self-pay | Admitting: *Deleted

## 2018-06-15 MED ORDER — TRIAMCINOLONE ACETONIDE 0.1 % EX OINT: 1.0000 "application " | TOPICAL_OINTMENT | Freq: Two times a day (BID) | CUTANEOUS | 1 refills | Status: AC

## 2018-06-15 MED ORDER — NYSTATIN 100000 UNIT/GM EX OINT: 1.0000 "application " | TOPICAL_OINTMENT | Freq: Two times a day (BID) | CUTANEOUS | 1 refills | Status: AC

## 2018-06-15 NOTE — Telephone Encounter (Signed)
Patient called stating nystatin-triamcinolone ointment was too expensive, I explained if I sent in 2 separate Rx it should be cheaper, patient told me to send.

## 2018-06-19 ENCOUNTER — Encounter: Payer: Self-pay | Admitting: Gastroenterology

## 2018-06-19 ENCOUNTER — Ambulatory Visit (AMBULATORY_SURGERY_CENTER): Payer: Self-pay

## 2018-06-19 VITALS — Ht 64.0 in | Wt 152.0 lb

## 2018-06-19 DIAGNOSIS — Z1211 Encounter for screening for malignant neoplasm of colon: Secondary | ICD-10-CM

## 2018-06-19 LAB — PAP IG W/ RFLX HPV ASCU

## 2018-06-19 NOTE — Progress Notes (Signed)
No egg or soy allergy known to patient  No issues with past sedation with any surgeries  or procedures, no intubation problems  No diet pills per patient No home 02 use per patient  No blood thinners per patient  Pt denies issues with constipation  No A fib or A flutter  EMMI video sent to pt's e mail. Pt declinedf

## 2018-06-28 ENCOUNTER — Ambulatory Visit (AMBULATORY_SURGERY_CENTER): Payer: 59 | Admitting: Gastroenterology

## 2018-06-28 ENCOUNTER — Encounter: Payer: Self-pay | Admitting: Gastroenterology

## 2018-06-28 VITALS — BP 131/86 | HR 70 | Temp 98.6°F | Resp 18 | Ht 64.0 in | Wt 152.0 lb

## 2018-06-28 DIAGNOSIS — Z1211 Encounter for screening for malignant neoplasm of colon: Secondary | ICD-10-CM

## 2018-06-28 MED ORDER — SODIUM CHLORIDE 0.9 % IV SOLN: 500.0000 mL | Freq: Once | INTRAVENOUS | Status: DC

## 2018-06-28 NOTE — Progress Notes (Signed)
Report to PACU, RN, vss, BBS= Clear.  

## 2018-06-28 NOTE — Op Note (Signed)
[facility] Patient Name: Mackenzie Thomas Procedure Date: 06/28/2018 9:23 AM MRN: [id number] Endoscopist: Tressia Danas MD, MD Age: 52 Referring MD:  Date of Birth: 08-05-1966 Gender: Female Account #: [id number] Procedure:                Colonoscopy Indications:              Screening for colorectal malignant neoplasm. No                            family history of colon cancer or poylps. No                            baseline GI symptoms. Medicines:                See the Anesthesia note for documentation of the                            administered medications Procedure:                Pre-Anesthesia Assessment:                           - Prior to the procedure, a History and Physical                            was performed, and patient medications and                            allergies were reviewed. The patient's tolerance of                            previous anesthesia was also reviewed. The risks                            and benefits of the procedure and the sedation                            options and risks were discussed with the patient.                            All questions were answered, and informed consent                            was obtained. Prior Anticoagulants: The patient has                            taken no previous anticoagulant or antiplatelet                            agents. ASA Grade Assessment: II - A patient with                            mild systemic disease. After reviewing the risks  and benefits, the patient was deemed in                            satisfactory condition to undergo the procedure.                           After obtaining informed consent, the colonoscope                            was passed under direct vision. Throughout the                            procedure, the patient's blood pressure, pulse, and                            oxygen saturations were monitored  continuously. The                            Colonoscope was introduced through the anus and                            advanced to the the terminal ileum, with                            identification of the appendiceal orifice and IC                            valve. The colonoscopy was performed without                            difficulty. The patient tolerated the procedure                            well. The quality of the bowel preparation was good. Scope In: 9:31:39 AM Scope Out: 9:44:24 AM Scope Withdrawal Time: 0 hours 7 minutes 11 seconds  Total Procedure Duration: 0 hours 12 minutes 45 seconds  Findings:                 The perianal and digital rectal examinations were                            normal.                           The entire examined colon appeared normal on direct                            and retroflexion views. Complications:            No immediate complications. Estimated Blood Loss:     Estimated blood loss: none. Impression:               - The entire examined colon is normal on direct and  retroflexion views.                           - No specimens collected. Recommendation:           - Discharge patient to home.                           - Resume regular diet.                           - Continue present medications.                           - Repeat colonoscopy in 10 years per protocol.                            Consider an earlier colonoscopy with any new                            interval GI symptoms between now and then. Tressia Danas MD, MD 06/28/2018 9:52:33 AM This report has been signed electronically.

## 2018-06-28 NOTE — Progress Notes (Signed)
Pt's states no medical or surgical changes since previsit or office visit. 

## 2018-06-28 NOTE — Patient Instructions (Signed)
  Thank you for allowing [place] to care for you today.  Resume previous diet and medications today.  Return to normal activities tomorrow.  Recommend screening colonoscopy in 10 years.  Contact GI if problems or concerns arise.     YOU HAD AN ENDOSCOPIC PROCEDURE TODAY AT THE  ENDOSCOPY CENTER:   Refer to the procedure report that was given to you for any specific questions about what was found during the examination.  If the procedure report does not answer your questions, please call your gastroenterologist to clarify.  If you requested that your care partner not be given the details of your procedure findings, then the procedure report has been included in a sealed envelope for you to review at your convenience later.  YOU SHOULD EXPECT: Some feelings of bloating in the abdomen. Passage of more gas than usual.  Walking can help get rid of the air that was put into your GI tract during the procedure and reduce the bloating. If you had a lower endoscopy (such as a colonoscopy or flexible sigmoidoscopy) you may notice spotting of blood in your stool or on the toilet paper. If you underwent a bowel prep for your procedure, you may not have a normal bowel movement for a few days.  Please Note:  You might notice some irritation and congestion in your nose or some drainage.  This is from the oxygen used during your procedure.  There is no need for concern and it should clear up in a day or so.  SYMPTOMS TO REPORT IMMEDIATELY:   Following lower endoscopy (colonoscopy or flexible sigmoidoscopy):  Excessive amounts of blood in the stool  Significant tenderness or worsening of abdominal pains  Swelling of the abdomen that is new, acute  Fever of 100F or higher       For urgent or emergent issues, a gastroenterologist can be reached at any hour by calling [phone].   DIET:  We do recommend a small meal at first, but then you may proceed to your regular diet.  Drink plenty of fluids  but you should avoid alcoholic beverages for 24 hours.  ACTIVITY:  You should plan to take it easy for the rest of today and you should NOT DRIVE or use heavy machinery until tomorrow (because of the sedation medicines used during the test).    FOLLOW UP: Our staff will call the number listed on your records the next business day following your procedure to check on you and address any questions or concerns that you may have regarding the information given to you following your procedure. If we do not reach you, we will leave a message.  However, if you are feeling well and you are not experiencing any problems, there is no need to return our call.  We will assume that you have returned to your regular daily activities without incident.  If any biopsies were taken you will be contacted by phone or by letter within the next 1-3 weeks.  Please call us at [phone] if you have not heard about the biopsies in 3 weeks.    SIGNATURES/CONFIDENTIALITY: You and/or your care partner have signed paperwork which will be entered into your electronic medical record.  These signatures attest to the fact that that the information above on your After Visit Summary has been reviewed and is understood.  Full responsibility of the confidentiality of this discharge information lies with you and/or your care-partner.

## 2018-06-29 ENCOUNTER — Telehealth: Payer: Self-pay

## 2018-06-29 NOTE — Telephone Encounter (Signed)
  Follow up Call-  Call Mackenzie Thomas number 06/28/2018  Post procedure Call Mackenzie Thomas phone  # [phone]  Permission to leave phone message Yes  Some recent data might be hidden     Patient questions:  Do you have a fever, pain , or abdominal swelling? No. Pain Score  0 *  Have you tolerated food without any problems? Yes.    Have you been able to return to your normal activities? Yes.    Do you have any questions about your discharge instructions: Diet   No. Medications  No. Follow up visit  No.  Do you have questions or concerns about your Care? No.  Actions: * If pain score is 4 or above: No action needed, pain <4.

## 2018-07-15 ENCOUNTER — Other Ambulatory Visit: Payer: Self-pay | Admitting: Women's Health

## 2018-07-15 DIAGNOSIS — F5101 Primary insomnia: Secondary | ICD-10-CM

## 2018-07-17 NOTE — Telephone Encounter (Signed)
Was prescribed on 10/1 with #30 x 1 refills but was set to print. I called pharmacy and they have not received that prescription. Ok to refill this in light of that info?

## 2018-07-17 NOTE — Telephone Encounter (Signed)
Called into pharmacy

## 2018-07-17 NOTE — Telephone Encounter (Signed)
Ok for refill? 

## 2018-08-12 ENCOUNTER — Other Ambulatory Visit: Payer: Self-pay | Admitting: Gynecology

## 2018-08-13 ENCOUNTER — Other Ambulatory Visit: Payer: Self-pay | Admitting: Gynecology

## 2018-08-26 ENCOUNTER — Other Ambulatory Visit: Payer: Self-pay | Admitting: Women's Health

## 2018-08-26 DIAGNOSIS — F419 Anxiety disorder, unspecified: Secondary | ICD-10-CM

## 2018-08-28 NOTE — Telephone Encounter (Signed)
Yes that is fine, thank you sorry for the confusion

## 2018-08-28 NOTE — Telephone Encounter (Signed)
Mackenzie Thomas, I received this refill request and checked with the pharmacy.  They never received the Rx you printed for her at her 06/14/18 visit for #30 Alprazolam 0.5 x 1 refill.  She received a refill on 07/15/18 but that was from the original Rx of 05/31/18.  I am going to go ahead and call it in but leave it under the 06/14/18 Rx date if that is okay.

## 2018-11-27 ENCOUNTER — Other Ambulatory Visit: Payer: Self-pay | Admitting: Women's Health

## 2018-11-27 DIAGNOSIS — F419 Anxiety disorder, unspecified: Secondary | ICD-10-CM

## 2018-11-27 NOTE — Telephone Encounter (Signed)
Rx called in 

## 2018-11-27 NOTE — Telephone Encounter (Signed)
Okay for refill?  

## 2018-12-01 ENCOUNTER — Encounter: Payer: Self-pay | Admitting: Women's Health

## 2018-12-25 ENCOUNTER — Other Ambulatory Visit: Payer: Self-pay | Admitting: *Deleted

## 2018-12-25 DIAGNOSIS — F419 Anxiety disorder, unspecified: Secondary | ICD-10-CM

## 2018-12-25 MED ORDER — CITALOPRAM HYDROBROMIDE 40 MG PO TABS: 40.0000 mg | ORAL_TABLET | Freq: Every day | ORAL | 1 refills | Status: DC

## 2018-12-25 MED ORDER — ESTRADIOL 1 MG PO TABS: 1.0000 mg | ORAL_TABLET | Freq: Every day | ORAL | 1 refills | Status: DC

## 2019-01-02 ENCOUNTER — Other Ambulatory Visit: Payer: Self-pay | Admitting: Women's Health

## 2019-01-02 ENCOUNTER — Telehealth: Payer: Self-pay | Admitting: *Deleted

## 2019-01-02 DIAGNOSIS — F5101 Primary insomnia: Secondary | ICD-10-CM

## 2019-01-02 DIAGNOSIS — F419 Anxiety disorder, unspecified: Secondary | ICD-10-CM

## 2019-01-02 MED ORDER — ZOLPIDEM TARTRATE 10 MG PO TABS: 10.0000 mg | ORAL_TABLET | Freq: Every evening | ORAL | 0 refills | Status: DC | PRN

## 2019-01-02 MED ORDER — ALPRAZOLAM 0.5 MG PO TABS: 0.5000 mg | ORAL_TABLET | Freq: Every day | ORAL | 0 refills | Status: DC | PRN

## 2019-01-02 NOTE — Telephone Encounter (Signed)
Rx denied

## 2019-01-02 NOTE — Telephone Encounter (Signed)
Patient called and left message requesting 90 day supply of Xanax 0.5 mg and Ambien 10 mg tablet and estradiol 1 mg ( this Rx has been sent for 90 day supply) She has lost her job and will be without insurance. I called [facility] and she last picked up last Rx for Xanax on 12/04/18 and has 1 refill left. It appears her last Rx for Ambien 10 mg per chart was 07/17/18. Please advise

## 2019-01-02 NOTE — Telephone Encounter (Signed)
patient informed, Both Rx called in.

## 2019-01-02 NOTE — Telephone Encounter (Signed)
Okay for refills of all meds, please give her our condolences for her lost job.  Review importance of using Xanax and Ambien sparingly not daily.

## 2019-04-16 ENCOUNTER — Encounter: Payer: Self-pay | Admitting: Women's Health

## 2019-04-17 ENCOUNTER — Other Ambulatory Visit: Payer: Self-pay

## 2019-04-17 DIAGNOSIS — Z20822 Contact with and (suspected) exposure to covid-19: Secondary | ICD-10-CM

## 2019-04-18 LAB — NOVEL CORONAVIRUS, NAA: SARS-CoV-2, NAA: NOT DETECTED

## 2019-05-07 ENCOUNTER — Other Ambulatory Visit: Payer: Self-pay

## 2019-05-07 DIAGNOSIS — Z20822 Contact with and (suspected) exposure to covid-19: Secondary | ICD-10-CM

## 2019-05-08 LAB — NOVEL CORONAVIRUS, NAA: SARS-CoV-2, NAA: NOT DETECTED

## 2019-05-30 ENCOUNTER — Other Ambulatory Visit: Payer: Self-pay | Admitting: Women's Health

## 2019-05-30 DIAGNOSIS — F419 Anxiety disorder, unspecified: Secondary | ICD-10-CM

## 2019-05-30 NOTE — Telephone Encounter (Signed)
Called into pharmacy

## 2019-06-06 ENCOUNTER — Encounter: Payer: Self-pay | Admitting: Gynecology

## 2019-06-20 ENCOUNTER — Ambulatory Visit (INDEPENDENT_AMBULATORY_CARE_PROVIDER_SITE_OTHER): Payer: BC Managed Care – PPO | Admitting: Women's Health

## 2019-06-20 ENCOUNTER — Encounter: Payer: Self-pay | Admitting: Women's Health

## 2019-06-20 ENCOUNTER — Other Ambulatory Visit: Payer: Self-pay

## 2019-06-20 VITALS — BP 116/72 | Ht 65.0 in | Wt 162.8 lb

## 2019-06-20 DIAGNOSIS — Z90722 Acquired absence of ovaries, bilateral: Secondary | ICD-10-CM

## 2019-06-20 DIAGNOSIS — Z01419 Encounter for gynecological examination (general) (routine) without abnormal findings: Secondary | ICD-10-CM | POA: Diagnosis not present

## 2019-06-20 DIAGNOSIS — Z9079 Acquired absence of other genital organ(s): Secondary | ICD-10-CM

## 2019-06-20 DIAGNOSIS — Z23 Encounter for immunization: Secondary | ICD-10-CM

## 2019-06-20 DIAGNOSIS — R7309 Other abnormal glucose: Secondary | ICD-10-CM | POA: Diagnosis not present

## 2019-06-20 DIAGNOSIS — Z13220 Encounter for screening for lipoid disorders: Secondary | ICD-10-CM

## 2019-06-20 DIAGNOSIS — Z90710 Acquired absence of both cervix and uterus: Secondary | ICD-10-CM

## 2019-06-20 DIAGNOSIS — F5101 Primary insomnia: Secondary | ICD-10-CM

## 2019-06-20 DIAGNOSIS — F419 Anxiety disorder, unspecified: Secondary | ICD-10-CM

## 2019-06-20 MED ORDER — ALPRAZOLAM 0.5 MG PO TABS: 0.5000 mg | ORAL_TABLET | Freq: Every day | ORAL | 1 refills | Status: DC | PRN

## 2019-06-20 MED ORDER — ESTRADIOL 0.1 MG/24HR TD PTTW: 1.0000 | MEDICATED_PATCH | TRANSDERMAL | 4 refills | Status: DC

## 2019-06-20 MED ORDER — CITALOPRAM HYDROBROMIDE 40 MG PO TABS: 40.0000 mg | ORAL_TABLET | Freq: Every day | ORAL | 4 refills | Status: DC

## 2019-06-20 MED ORDER — ZOLPIDEM TARTRATE 10 MG PO TABS: 10.0000 mg | ORAL_TABLET | Freq: Every evening | ORAL | 0 refills | Status: DC | PRN

## 2019-06-20 NOTE — Patient Instructions (Addendum)
It was good to see you!  Health Maintenance for Postmenopausal Women Menopause is a normal process in which your ability to get pregnant comes to an end. This process happens slowly over many months or years, usually between the ages of 52 and 52. Menopause is complete when you have missed your menstrual periods for 12 months. It is important to talk with your health care provider about some of the most common conditions that affect women after menopause (postmenopausal women). These include heart disease, cancer, and bone loss (osteoporosis). Adopting a healthy lifestyle and getting preventive care can help to promote your health and wellness. The actions you take can also lower your chances of developing some of these common conditions. What should I know about menopause? During menopause, you may get a number of symptoms, such as:  Hot flashes. These can be moderate or severe.  Night sweats.  Decrease in sex drive.  Mood swings.  Headaches.  Tiredness.  Irritability.  Memory problems.  Insomnia. Choosing to treat or not to treat these symptoms is a decision that you make with your health care provider. Do I need hormone replacement therapy?  Hormone replacement therapy is effective in treating symptoms that are caused by menopause, such as hot flashes and night sweats.  Hormone replacement carries certain risks, especially as you become older. If you are thinking about using estrogen or estrogen with progestin, discuss the benefits and risks with your health care provider. What is my risk for heart disease and stroke? The risk of heart disease, heart attack, and stroke increases as you age. One of the causes may be a change in the body's hormones during menopause. This can affect how your body uses dietary fats, triglycerides, and cholesterol. Heart attack and stroke are medical emergencies. There are many things that you can do to help prevent heart disease and stroke. Watch your  blood pressure  High blood pressure causes heart disease and increases the risk of stroke. This is more likely to develop in people who have high blood pressure readings, are of African descent, or are overweight.  Have your blood pressure checked: ? Every 3-5 years if you are 52-52 years of age. ? Every year if you are 52 years old or older. Eat a healthy diet   Eat a diet that includes plenty of vegetables, fruits, low-fat dairy products, and lean protein.  Do not eat a lot of foods that are high in solid fats, added sugars, or sodium. Get regular exercise Get regular exercise. This is one of the most important things you can do for your health. Most adults should:  Try to exercise for at least 150 minutes each week. The exercise should increase your heart rate and make you sweat (moderate-intensity exercise).  Try to do strengthening exercises at least twice each week. Do these in addition to the moderate-intensity exercise.  Spend less time sitting. Even light physical activity can be beneficial. Other tips  Work with your health care provider to achieve or maintain a healthy weight.  Do not use any products that contain nicotine or tobacco, such as cigarettes, e-cigarettes, and chewing tobacco. If you need help quitting, ask your health care provider.  Know your numbers. Ask your health care provider to check your cholesterol and your blood sugar (glucose). Continue to have your blood tested as directed by your health care provider. Do I need screening for cancer? Depending on your health history and family history, you may need to have cancer screening  at different stages of your life. This may include screening for:  Breast cancer.  Cervical cancer.  Lung cancer.  Colorectal cancer. What is my risk for osteoporosis? After menopause, you may be at increased risk for osteoporosis. Osteoporosis is a condition in which bone destruction happens more quickly than new bone  creation. To help prevent osteoporosis or the bone fractures that can happen because of osteoporosis, you may take the following actions:  If you are 52-52 years old, get at least 1,000 mg of calcium and at least 600 mg of vitamin D per day.  If you are older than age 52 but younger than age 52, get at least 1,200 mg of calcium and at least 600 mg of vitamin D per day.  If you are older than age 52, get at least 1,200 mg of calcium and at least 800 mg of vitamin D per day. Smoking and drinking excessive alcohol increase the risk of osteoporosis. Eat foods that are rich in calcium and vitamin D, and do weight-bearing exercises several times each week as directed by your health care provider. How does menopause affect my mental health? Depression may occur at any age, but it is more common as you become older. Common symptoms of depression include:  Low or sad mood.  Changes in sleep patterns.  Changes in appetite or eating patterns.  Feeling an overall lack of motivation or enjoyment of activities that you previously enjoyed.  Frequent crying spells. Talk with your health care provider if you think that you are experiencing depression. General instructions See your health care provider for regular wellness exams and vaccines. This may include:  Scheduling regular health, dental, and eye exams.  Getting and maintaining your vaccines. These include: ? Influenza vaccine. Get this vaccine each year before the flu season begins. ? Pneumonia vaccine. ? Shingles vaccine. ? Tetanus, diphtheria, and pertussis (Tdap) booster vaccine. Your health care provider may also recommend other immunizations. Tell your health care provider if you have ever been abused or do not feel safe at home. Summary  Menopause is a normal process in which your ability to get pregnant comes to an end.  This condition causes hot flashes, night sweats, decreased interest in sex, mood swings, headaches, or lack of  sleep.  Treatment for this condition may include hormone replacement therapy.  Take actions to keep yourself healthy, including exercising regularly, eating a healthy diet, watching your weight, and checking your blood pressure and blood sugar levels.  Get screened for cancer and depression. Make sure that you are up to date with all your vaccines. This information is not intended to replace advice given to you by your health care provider. Make sure you discuss any questions you have with your health care provider. Document Released: 10/22/2005 Document Revised: 08/23/2018 Document Reviewed: 08/23/2018 Elsevier Patient Education  2020 [employer].

## 2019-06-20 NOTE — Progress Notes (Signed)
Mackenzie Thomas [birth year] [id number]    History:    Presents for annual exam.  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  On HRT with continued hot flushes.  Normal Pap after.  Normal mammogram history.  06/2018- colonoscopy.  Anxiety/depression stable on Celexa.  Occasional Ambien for sleep and Xanax for anxiety.  Past medical history, past surgical history, family history and social history were all reviewed and documented in the EPIC chart.  Mackenzie Thomas 21 type 1 diabetes at college doing well.  Mackenzie Thomas 17.  Engaged planning wedding for fall 2021.  ROS:  A ROS was performed and pertinent positives and negatives are included.  Exam:  Vitals:   06/20/19 0924  BP: 116/72  Weight: 162 lb 12.8 oz (73.8 kg)  Height: 5\' 5"  (1.651 m)   Body mass index is 27.09 kg/m.   General appearance:  Normal Thyroid:  Symmetrical, normal in size, without palpable masses or nodularity. Respiratory  Auscultation:  Clear without wheezing or rhonchi Cardiovascular  Auscultation:  Regular rate, without rubs, murmurs or gallops  Edema/varicosities:  Not grossly evident Abdominal  Soft,nontender, without masses, guarding or rebound.  Liver/spleen:  No organomegaly noted  Hernia:  None appreciated  Skin  Inspection:  Grossly normal   Breasts: Examined lying and sitting.     Right: Without masses, retractions, discharge or axillary adenopathy.     Left: Without masses, retractions, discharge or axillary adenopathy. Gentitourinary   Inguinal/mons:  Normal without inguinal adenopathy  External genitalia:  Normal  BUS/Urethra/Skene's glands:  Normal  Vagina:  Normal  Cervix: Absent   Uterus: Absent adnexa/parametria:     Rt: Without masses or tenderness.   Lt: Without masses or tenderness.  Anus and perineum: Normal  Digital rectal exam: Normal sphincter tone without palpated masses or tenderness  Assessment/Plan:  53 y.o. engaged WF G2, P2 for annual exam with always feeling hot and  occasional hot flushes.     2018 TVH with BSO on HRT for persistent dysplasia normal Paps after Anxiety/depression stable on Celexa Insomnia-occasional Ambien  Plan: Options for HRT reviewed will stop estradiol 1 mg and try Vivelle 0.01 patch twice weekly, instructed to call if continued problems with feeling hot always.  SBEs, continue annual 3D screening mammogram, calcium rich foods, vitamin D 2000 daily encouraged.  Reviewed importance of annual screening mammogram has had at [facility].  Celexa 40 mg p.o. daily prescription, proper use given and reviewed.  Counseling as needed.  Xanax 0.5 prescription given reviewed importance of taking it sparingly, addictive properties reviewed.  Ambien 10 mg at bedtime prescription given reviewed importance of using sparingly states she uses mostly with travel.  Self-care, leisure activities encouraged.  Increase regular cardio type and weightbearing exercise, decrease calorie/carbs has had steady weight gain over the past few years.  CBC, CMP, hemoglobin A1c, lipid panel, Pap. Huel Cote [facility], 11:07 AM 06/20/2019

## 2019-06-20 NOTE — Addendum Note (Signed)
Addended by: Alen Blew on: 06/20/2019 01:55 PM   Modules accepted: Orders

## 2019-06-21 ENCOUNTER — Encounter: Payer: Self-pay | Admitting: Women's Health

## 2019-06-21 LAB — CBC WITH DIFFERENTIAL/PLATELET
Absolute Monocytes: 408 cells/uL (ref 200–950)
Basophils Absolute: 32 cells/uL (ref 0–200)
Basophils Relative: 0.6 %
Eosinophils Absolute: 191 cells/uL (ref 15–500)
Eosinophils Relative: 3.6 %
HCT: 38.2 % (ref 35.0–45.0)
Hemoglobin: 12.9 g/dL (ref 11.7–15.5)
Lymphs Abs: 1023 cells/uL (ref 850–3900)
MCH: 31.6 pg (ref 27.0–33.0)
MCHC: 33.8 g/dL (ref 32.0–36.0)
MCV: 93.6 fL (ref 80.0–100.0)
MPV: 10.3 fL (ref 7.5–12.5)
Monocytes Relative: 7.7 %
Neutro Abs: 3646 cells/uL (ref 1500–7800)
Neutrophils Relative %: 68.8 %
Platelets: 249 10*3/uL (ref 140–400)
RBC: 4.08 10*6/uL (ref 3.80–5.10)
RDW: 12.2 % (ref 11.0–15.0)
Total Lymphocyte: 19.3 %
WBC: 5.3 10*3/uL (ref 3.8–10.8)

## 2019-06-21 LAB — COMPREHENSIVE METABOLIC PANEL
AG Ratio: 1.4 (calc) (ref 1.0–2.5)
ALT: 14 U/L (ref 6–29)
AST: 17 U/L (ref 10–35)
Albumin: 4.1 g/dL (ref 3.6–5.1)
Alkaline phosphatase (APISO): 54 U/L (ref 37–153)
BUN: 13 mg/dL (ref 7–25)
CO2: 31 mmol/L (ref 20–32)
Calcium: 9.1 mg/dL (ref 8.6–10.4)
Chloride: 102 mmol/L (ref 98–110)
Creat: 0.74 mg/dL (ref 0.50–1.05)
Globulin: 3 g/dL (calc) (ref 1.9–3.7)
Glucose, Bld: 104 mg/dL — ABNORMAL HIGH (ref 65–99)
Potassium: 4.3 mmol/L (ref 3.5–5.3)
Sodium: 136 mmol/L (ref 135–146)
Total Bilirubin: 0.5 mg/dL (ref 0.2–1.2)
Total Protein: 7.1 g/dL (ref 6.1–8.1)

## 2019-06-21 LAB — LIPID PANEL
Cholesterol: 152 mg/dL (ref <200)
HDL: 61 mg/dL (ref >=50)
LDL Cholesterol (Calc): 74 mg/dL (calc)
Non-HDL Cholesterol (Calc): 91 mg/dL (calc) (ref <130)
Total CHOL/HDL Ratio: 2.5 (calc) (ref <5.0)
Triglycerides: 86 mg/dL (ref <150)

## 2019-06-21 LAB — HEMOGLOBIN A1C
Hgb A1c MFr Bld: 5.4 % of total Hgb (ref <5.7)
Mean Plasma Glucose: 108 (calc)
eAG (mmol/L): 6 (calc)

## 2019-06-22 LAB — PAP IG W/ RFLX HPV ASCU

## 2019-07-02 ENCOUNTER — Encounter: Payer: Self-pay | Admitting: Women's Health

## 2019-07-06 ENCOUNTER — Other Ambulatory Visit: Payer: Self-pay | Admitting: Women's Health

## 2019-07-06 DIAGNOSIS — F419 Anxiety disorder, unspecified: Secondary | ICD-10-CM

## 2019-07-16 ENCOUNTER — Encounter: Payer: Self-pay | Admitting: Women's Health

## 2019-07-16 DIAGNOSIS — F419 Anxiety disorder, unspecified: Secondary | ICD-10-CM

## 2019-07-20 MED ORDER — ESTRADIOL 0.1 MG/24HR TD PTTW: 1.0000 | MEDICATED_PATCH | TRANSDERMAL | 4 refills | Status: DC

## 2019-07-20 MED ORDER — CITALOPRAM HYDROBROMIDE 40 MG PO TABS: 40.0000 mg | ORAL_TABLET | Freq: Every day | ORAL | 4 refills | Status: DC

## 2019-07-20 NOTE — Telephone Encounter (Signed)
Ok for celexa refill, have her try estradiol  [address] [place] Progression Recent Vital Signs   @VS @   Past Medical History:  Diagnosis Date  . Allergy   . Anxiety   . 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 Seasonal allergies      Expected Discharge Date  @FLOW (169678::9)@  Diet Order    None       VTE Documentation  @FLOW (3810175::1)@   Work Intensity Score/Level of Care  @FLOW (10536::1)@  @LEVELOFCARE @   Mobility  @FLOW (7060220::1)@     Significant Events    DC Barriers   Abnormal Labs:  Huel Cote 07/20/2019, 5:43 PM

## 2019-07-20 NOTE — Telephone Encounter (Signed)
Mackenzie Thomas - pt pays $300 for a 3 month supply for her patches. What would be a cheaper option for her?  Thanks kari CMA

## 2019-07-20 NOTE — Telephone Encounter (Signed)
Disregard whatever below is????  Any way ok for celexa and have her try estradiol .5mg  po daily, should be $10 for 3 mo supply. Pl apologize, no idea so expensive.

## 2019-07-20 NOTE — Telephone Encounter (Signed)
Computer went out please see message   Disregard below note [place] ???

## 2019-07-23 ENCOUNTER — Encounter: Payer: Self-pay | Admitting: *Deleted

## 2019-07-24 ENCOUNTER — Other Ambulatory Visit: Payer: Self-pay | Admitting: Women's Health

## 2019-07-24 MED ORDER — ESTRADIOL 1 MG PO TABS: 1.0000 mg | ORAL_TABLET | Freq: Every day | ORAL | 4 refills | Status: DC

## 2019-07-24 NOTE — Telephone Encounter (Signed)
Telephone call, will take 1 mg of estradiol daily, reviewed increasing to 1.5 mg daily states will try the 1 mg and call if continues with numerous hot flushes.

## 2019-08-13 ENCOUNTER — Other Ambulatory Visit: Payer: Self-pay | Admitting: Women's Health

## 2019-08-13 DIAGNOSIS — F419 Anxiety disorder, unspecified: Secondary | ICD-10-CM

## 2019-10-02 ENCOUNTER — Other Ambulatory Visit: Payer: Self-pay

## 2019-10-02 DIAGNOSIS — F5101 Primary insomnia: Secondary | ICD-10-CM

## 2019-10-02 DIAGNOSIS — F419 Anxiety disorder, unspecified: Secondary | ICD-10-CM

## 2019-10-02 MED ORDER — ALPRAZOLAM 0.5 MG PO TABS: 0.5000 mg | ORAL_TABLET | Freq: Every day | ORAL | 0 refills | Status: DC | PRN

## 2019-10-02 MED ORDER — ZOLPIDEM TARTRATE 10 MG PO TABS: 10.0000 mg | ORAL_TABLET | Freq: Every evening | ORAL | 0 refills | Status: DC | PRN

## 2019-10-02 NOTE — Telephone Encounter (Signed)
Mackenzie Thomas, You prescribed in October #90 Ambien but the Rx printed. I checked with pharmacy and the last time they show she had it was 2019.  Did not get it in 2020. Ok to call in that October Rx at this time?

## 2020-02-01 ENCOUNTER — Other Ambulatory Visit: Payer: Self-pay | Admitting: *Deleted

## 2020-02-01 DIAGNOSIS — F419 Anxiety disorder, unspecified: Secondary | ICD-10-CM

## 2020-02-01 MED ORDER — ALPRAZOLAM 0.5 MG PO TABS: 0.5000 mg | ORAL_TABLET | Freq: Every day | ORAL | 0 refills | Status: DC | PRN

## 2020-02-01 NOTE — Telephone Encounter (Signed)
Last filled on 10/02/19 #60 with 0 refills.

## 2020-04-04 ENCOUNTER — Other Ambulatory Visit: Payer: Self-pay | Admitting: Nurse Practitioner

## 2020-04-04 DIAGNOSIS — F419 Anxiety disorder, unspecified: Secondary | ICD-10-CM

## 2020-04-07 NOTE — Telephone Encounter (Signed)
Spoke with patient and informed her. °

## 2020-04-17 ENCOUNTER — Encounter: Payer: Self-pay | Admitting: Obstetrics and Gynecology

## 2020-07-17 ENCOUNTER — Encounter: Payer: BC Managed Care – PPO | Admitting: Nurse Practitioner

## 2020-07-17 ENCOUNTER — Other Ambulatory Visit: Payer: Self-pay

## 2020-07-17 DIAGNOSIS — F419 Anxiety disorder, unspecified: Secondary | ICD-10-CM

## 2020-07-18 MED ORDER — CITALOPRAM HYDROBROMIDE 40 MG PO TABS: 40.0000 mg | ORAL_TABLET | Freq: Every day | ORAL | 0 refills | Status: DC

## 2020-07-18 NOTE — Telephone Encounter (Signed)
Patient is scheduled 07/21/20 with TW for Annual exam.

## 2020-07-21 ENCOUNTER — Encounter: Payer: Self-pay | Admitting: Nurse Practitioner

## 2020-07-21 ENCOUNTER — Other Ambulatory Visit: Payer: Self-pay

## 2020-07-21 ENCOUNTER — Ambulatory Visit (INDEPENDENT_AMBULATORY_CARE_PROVIDER_SITE_OTHER): Payer: 59 | Admitting: Nurse Practitioner

## 2020-07-21 VITALS — BP 124/80 | Ht 65.0 in | Wt 146.0 lb

## 2020-07-21 DIAGNOSIS — F419 Anxiety disorder, unspecified: Secondary | ICD-10-CM | POA: Diagnosis not present

## 2020-07-21 DIAGNOSIS — Z90710 Acquired absence of both cervix and uterus: Secondary | ICD-10-CM

## 2020-07-21 DIAGNOSIS — Z01419 Encounter for gynecological examination (general) (routine) without abnormal findings: Secondary | ICD-10-CM

## 2020-07-21 DIAGNOSIS — Z9079 Acquired absence of other genital organ(s): Secondary | ICD-10-CM

## 2020-07-21 DIAGNOSIS — Z79890 Hormone replacement therapy (postmenopausal): Secondary | ICD-10-CM | POA: Diagnosis not present

## 2020-07-21 DIAGNOSIS — Z90722 Acquired absence of ovaries, bilateral: Secondary | ICD-10-CM

## 2020-07-21 MED ORDER — ESTRADIOL 1 MG PO TABS: 1.0000 mg | ORAL_TABLET | Freq: Every day | ORAL | 4 refills | Status: DC

## 2020-07-21 MED ORDER — ALPRAZOLAM 0.5 MG PO TABS: ORAL_TABLET | ORAL | 0 refills | Status: DC

## 2020-07-21 NOTE — Patient Instructions (Signed)

## 2020-07-21 NOTE — Progress Notes (Signed)
   Mackenzie Thomas August 24, 1966 [id number]   History:  54 y.o. G2P2002 presents for annual exam. 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 Subsequent paps normal. On HRT for menopausal symptoms. Normal mammogram history. Anxiety/depression stable on Celexa. Takes an occasional Ambien 5 mg for insomnia. Xanax occasionally for anxiety and flying with work.   Gynecologic History No LMP recorded (lmp unknown). Patient has had a hysterectomy.   Contraception: status post hysterectomy Last Pap: 06/20/2019. Results were: normal Last mammogram: 04/17/2020. Results were: normal Last colonoscopy: 06/28/2018. Results were: normal Last Dexa: Never  Past medical history, past surgical history, family history and social history were all reviewed and documented in the EPIC chart.  ROS:  A ROS was performed and pertinent positives and negatives are included.  Exam:  Vitals:   07/21/20 1200  BP: 124/80  Weight: 146 lb (66.2 kg)  Height: 5\' 5"  (1.651 m)   Body mass index is 24.3 kg/m.  General appearance:  Normal Thyroid:  Symmetrical, normal in size, without palpable masses or nodularity. Respiratory  Auscultation:  Clear without wheezing or rhonchi Cardiovascular  Auscultation:  Regular rate, without rubs, murmurs or gallops  Edema/varicosities:  Not grossly evident Abdominal  Soft,nontender, without masses, guarding or rebound.  Liver/spleen:  No organomegaly noted  Hernia:  None appreciated  Skin  Inspection:  Grossly normal   Breasts: Examined lying and sitting.   Right: Without masses, retractions, discharge or axillary adenopathy.   Left: Without masses, retractions, discharge or axillary adenopathy. Gentitourinary   Inguinal/mons:  Normal without inguinal adenopathy  External genitalia:  Normal  BUS/Urethra/Skene's glands:  Normal  Vagina:  Normal  Cervix:  Absent  Uterus:  Absent  Adnexa/parametria:     Rt: Without masses or tenderness.   Lt: Without masses or  tenderness.  Anus and perineum: Normal  Digital rectal exam: Normal sphincter tone without palpated masses or tenderness  Assessment/Plan:  54 y.o. 45 for annual exam.   Well female exam with routine gynecological exam - Education provided on SBEs, importance of preventative screenings, current guidelines, high calcium diet, regular exercise, and multivitamin daily. Labs with PCP.  S/P total hysterectomy and bilateral salpingo-oophorectomy - 2018 for persistent dysplasia. Subsequent paps normal. Plan: Pap IG w/ reflex to HPV when ASC-U.   Hormone replacement therapy (HRT) - Plan: estradiol (ESTRACE) 1 MG tablet daily with good management of postmenopausal symptoms. She is aware of the slight risks for blood clots, heart attack, stroke, and breast cancer. She would like to continue. Refill x 1 year provided.   Anxiety - Plan: ALPRAZolam (XANAX) 0.5 MG tablet. Takes 1/2 tablet occasionally for anxious mood and flying with work. She is aware of the addictive properties and recommendations to take sparingly. #30 with no refills provided.   Screening for breast cancer - normal mammogram history. Continue annual screenings. Normal breast exam today.   Screening for colon cancer - normal screening colonoscopy in 2019. Will repeat at GI's recommended interval.  Follow up in 1 year for annual.       2020 [facility], 12:24 PM 07/21/2020

## 2020-07-24 LAB — PAP IG W/ RFLX HPV ASCU

## 2020-07-24 LAB — HUMAN PAPILLOMAVIRUS, HIGH RISK: HPV DNA High Risk: NOT DETECTED

## 2020-07-24 NOTE — Telephone Encounter (Signed)
In reply to TW note "Hi Hanya, Your pap did show some atypical cells but was negative for HPV. This does not require any treatment at this time and we will repeat a pap next year. Have a great weekend!"

## 2020-08-12 ENCOUNTER — Other Ambulatory Visit: Payer: Self-pay | Admitting: *Deleted

## 2020-08-12 DIAGNOSIS — F5101 Primary insomnia: Secondary | ICD-10-CM

## 2020-08-12 MED ORDER — ZOLPIDEM TARTRATE 10 MG PO TABS: 10.0000 mg | ORAL_TABLET | Freq: Every evening | ORAL | 0 refills | Status: DC | PRN

## 2020-09-26 ENCOUNTER — Other Ambulatory Visit: Payer: Self-pay | Admitting: Nurse Practitioner

## 2020-09-26 DIAGNOSIS — F419 Anxiety disorder, unspecified: Secondary | ICD-10-CM

## 2020-09-26 NOTE — Telephone Encounter (Signed)
Last filled on 07/21/20

## 2020-12-08 ENCOUNTER — Other Ambulatory Visit: Payer: Self-pay | Admitting: *Deleted

## 2020-12-08 DIAGNOSIS — F419 Anxiety disorder, unspecified: Secondary | ICD-10-CM

## 2020-12-08 MED ORDER — CITALOPRAM HYDROBROMIDE 40 MG PO TABS: 40.0000 mg | ORAL_TABLET | Freq: Every day | ORAL | 2 refills | Status: DC

## 2020-12-08 NOTE — Telephone Encounter (Signed)
Medication refill request: Citalopram  Last AEX:  07-21-20 TW Next AEX: 07-22-21  Last MMG (if hormonal medication request): n/a Refill authorized: Today, please advise.   Medication pended for #90, 2RF. Please refill if appropriate.

## 2020-12-18 ENCOUNTER — Other Ambulatory Visit: Payer: Self-pay | Admitting: Nurse Practitioner

## 2020-12-18 DIAGNOSIS — F419 Anxiety disorder, unspecified: Secondary | ICD-10-CM

## 2020-12-19 NOTE — Telephone Encounter (Signed)
Pt requesting Xanax last annual 07/21/2020. Will route to Wyline Beady NP for approval or denial.

## 2021-02-15 ENCOUNTER — Other Ambulatory Visit: Payer: Self-pay | Admitting: Nurse Practitioner

## 2021-02-15 DIAGNOSIS — F419 Anxiety disorder, unspecified: Secondary | ICD-10-CM

## 2021-02-16 NOTE — Telephone Encounter (Signed)
Medication refill request: xanax  Last AEX: 07-21-20 TW Next AEX: 07-22-21 Last MMG (if hormonal medication request): n/a Refill authorized: Today, please advise.   Medication pended for #30, 0RF. Please refill if appropriate.

## 2021-04-13 ENCOUNTER — Other Ambulatory Visit: Payer: Self-pay | Admitting: Nurse Practitioner

## 2021-04-13 DIAGNOSIS — F419 Anxiety disorder, unspecified: Secondary | ICD-10-CM

## 2021-04-13 DIAGNOSIS — F5101 Primary insomnia: Secondary | ICD-10-CM

## 2021-04-14 NOTE — Telephone Encounter (Signed)
Rx called into the pharmacy. 

## 2021-04-14 NOTE — Telephone Encounter (Signed)
Refill request on Ambien and xanax. Last annual 07/21/2020. I will route to provider for approval/denial.

## 2021-07-22 ENCOUNTER — Ambulatory Visit: Payer: 59 | Admitting: Nurse Practitioner

## 2021-08-05 ENCOUNTER — Other Ambulatory Visit: Payer: Self-pay | Admitting: Nurse Practitioner

## 2021-08-05 ENCOUNTER — Other Ambulatory Visit: Payer: Self-pay

## 2021-08-05 DIAGNOSIS — F419 Anxiety disorder, unspecified: Secondary | ICD-10-CM

## 2021-08-05 DIAGNOSIS — Z79890 Hormone replacement therapy (postmenopausal): Secondary | ICD-10-CM

## 2021-08-05 NOTE — Telephone Encounter (Signed)
Patient scheduled on 09/17/2021

## 2021-08-05 NOTE — Telephone Encounter (Signed)
AEX 07/21/2020 Scheduled AEX 09/17/2021

## 2021-08-10 MED ORDER — ESTRADIOL 1 MG PO TABS: 1.0000 mg | ORAL_TABLET | Freq: Every day | ORAL | 0 refills | Status: DC

## 2021-09-16 NOTE — Progress Notes (Deleted)
Mackenzie Thomas December 08, 1965 [id number]   History:  56 y.o. G2P2002 presents for annual exam. 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 Subsequent paps normal. On HRT for menopausal symptoms. Normal mammogram history. Anxiety/depression stable on Celexa. Takes an occasional Ambien 5 mg for insomnia. Xanax occasionally for anxiety and flying with work.   Gynecologic History No LMP recorded (lmp unknown). Patient has had a hysterectomy.   Contraception: status post hysterectomy Sexually active: Yes  Health maintenance Last Pap: 07/21/2020. Results were: ASCUS negative HPV Last mammogram: 05/06/2021. Results were: Normal Last colonoscopy: 06/28/2018. Results were: Normal, 10-year recall Last Dexa: Not indicated  Past medical history, past surgical history, family history and social history were all reviewed and documented in the EPIC chart.  ROS:  A ROS was performed and pertinent positives and negatives are included.  Exam:  There were no vitals filed for this visit.  There is no height or weight on file to calculate BMI.  General appearance:  Normal Thyroid:  Symmetrical, normal in size, without palpable masses or nodularity. Respiratory  Auscultation:  Clear without wheezing or rhonchi Cardiovascular  Auscultation:  Regular rate, without rubs, murmurs or gallops  Edema/varicosities:  Not grossly evident Abdominal  Soft,nontender, without masses, guarding or rebound.  Liver/spleen:  No organomegaly noted  Hernia:  None appreciated  Skin  Inspection:  Grossly normal   Breasts: Examined lying and sitting.   Right: Without masses, retractions, discharge or axillary adenopathy.   Left: Without masses, retractions, discharge or axillary adenopathy. Genitourinary   Inguinal/mons:  Normal without inguinal adenopathy  External genitalia:  Normal appearing vulva with no masses, tenderness, or lesions  BUS/Urethra/Skene's glands:  Normal  Vagina:  Normal appearing with  normal color and discharge, no lesions  Cervix:  Absent  Uterus:  Absent  Adnexa/parametria:     Rt: Normal in size, without masses or tenderness.   Lt: Normal in size, without masses or tenderness.  Anus and perineum: Normal  Digital rectal exam: Normal sphincter tone without palpated masses or tenderness  Patient informed chaperone available to be present for breast and pelvic exam. Patient has requested no chaperone to be present. Patient has been advised what will be completed during breast and pelvic exam.   Assessment/Plan:  56 y.o. G2P2002 for annual exam.   Well female exam with routine gynecological exam - Education provided on SBEs, importance of preventative screenings, current guidelines, high calcium diet, regular exercise, and multivitamin daily. Labs with PCP.  S/P total hysterectomy and bilateral salpingo-oophorectomy - 2018 for persistent dysplasia. Subsequent paps normal. Plan: Pap IG w/ reflex to HPV when ASC-U.   Hormone replacement therapy (HRT) - Plan: estradiol (ESTRACE) 1 MG tablet daily with good management of postmenopausal symptoms. She is aware of the slight risks for blood clots, heart attack, stroke, and breast cancer. She would like to continue. Refill x 1 year provided.   Anxiety - Plan: ALPRAZolam (XANAX) 0.5 MG tablet. Takes 1/2 tablet occasionally for anxious mood and flying with work. She is aware of the addictive properties and recommendations to take sparingly. #30 with no refills provided.   Screening for cervical cancer - H/O persistent cervical dysplasia - S/P TVH BSO. Paps normal since.   Screening for breast cancer - Normal mammogram history. Continue annual screenings. Normal breast exam today.   Screening for colon cancer - Normal screening colonoscopy in 2019. Will repeat at GI's recommended interval.  Follow up in 1 year for annual.  Mackenzie Thomas [facility], 2:01 PM 09/16/2021

## 2021-09-17 ENCOUNTER — Ambulatory Visit: Payer: 59 | Admitting: Nurse Practitioner

## 2021-09-17 DIAGNOSIS — F419 Anxiety disorder, unspecified: Secondary | ICD-10-CM

## 2021-09-17 DIAGNOSIS — Z79890 Hormone replacement therapy (postmenopausal): Secondary | ICD-10-CM

## 2021-09-17 DIAGNOSIS — Z780 Asymptomatic menopausal state: Secondary | ICD-10-CM

## 2021-09-17 DIAGNOSIS — Z01419 Encounter for gynecological examination (general) (routine) without abnormal findings: Secondary | ICD-10-CM

## 2021-10-06 ENCOUNTER — Other Ambulatory Visit: Payer: Self-pay | Admitting: Nurse Practitioner

## 2021-10-06 DIAGNOSIS — F419 Anxiety disorder, unspecified: Secondary | ICD-10-CM

## 2021-10-06 NOTE — Telephone Encounter (Signed)
I called pharmacy and denied Rx refill patient is overdue for annual exam.

## 2021-12-01 ENCOUNTER — Other Ambulatory Visit: Payer: Self-pay | Admitting: Internal Medicine

## 2021-12-01 DIAGNOSIS — E785 Hyperlipidemia, unspecified: Secondary | ICD-10-CM

## 2021-12-08 ENCOUNTER — Other Ambulatory Visit: Payer: Self-pay

## 2021-12-08 DIAGNOSIS — Z79890 Hormone replacement therapy (postmenopausal): Secondary | ICD-10-CM

## 2022-01-06 ENCOUNTER — Other Ambulatory Visit: Payer: Self-pay

## 2022-01-29 ENCOUNTER — Ambulatory Visit
Admission: RE | Admit: 2022-01-29 | Discharge: 2022-01-29 | Disposition: A | Discharge status: Home / Self Care | Payer: No Typology Code available for payment source | Source: Ambulatory Visit | Attending: Internal Medicine | Admitting: Internal Medicine

## 2022-01-29 DIAGNOSIS — E785 Hyperlipidemia, unspecified: Secondary | ICD-10-CM

## 2022-02-01 ENCOUNTER — Other Ambulatory Visit: Payer: Self-pay

## 2022-02-01 DIAGNOSIS — Z79890 Hormone replacement therapy (postmenopausal): Secondary | ICD-10-CM

## 2022-02-01 DIAGNOSIS — F419 Anxiety disorder, unspecified: Secondary | ICD-10-CM

## 2022-02-01 NOTE — Telephone Encounter (Signed)
Med refill request: Last AEX: 07/21/20 Next AEX: not scheduled Last MMG (if hormonal med) 05/06/21  RF's received from [facility] for Estradiol 1mg  #90 and Citalopram 40mg  #90.  Patient is due for AEX and no appointment is scheduled.  Both RX rfs denied.

## 2022-02-03 ENCOUNTER — Other Ambulatory Visit: Payer: Self-pay | Admitting: Nurse Practitioner

## 2022-02-03 DIAGNOSIS — F419 Anxiety disorder, unspecified: Secondary | ICD-10-CM

## 2022-02-03 DIAGNOSIS — Z79890 Hormone replacement therapy (postmenopausal): Secondary | ICD-10-CM

## 2022-02-03 MED ORDER — ESTRADIOL 1 MG PO TABS: 1.0000 mg | ORAL_TABLET | Freq: Every day | ORAL | 0 refills | Status: DC

## 2022-02-03 MED ORDER — CITALOPRAM HYDROBROMIDE 40 MG PO TABS: 40.0000 mg | ORAL_TABLET | Freq: Every day | ORAL | 0 refills | Status: DC

## 2022-02-03 NOTE — Telephone Encounter (Signed)
Message sent to appointments to schedule. 

## 2022-02-03 NOTE — Telephone Encounter (Signed)
Mackenzie Thomas pt scheduled appt for 03/10/22  Last annual exam was 07/2020

## 2022-03-10 ENCOUNTER — Ambulatory Visit (INDEPENDENT_AMBULATORY_CARE_PROVIDER_SITE_OTHER): Payer: 59 | Admitting: Nurse Practitioner

## 2022-03-10 ENCOUNTER — Encounter: Payer: Self-pay | Admitting: Nurse Practitioner

## 2022-03-10 ENCOUNTER — Other Ambulatory Visit (HOSPITAL_COMMUNITY)
Admission: RE | Admit: 2022-03-10 | Discharge: 2022-03-10 | Disposition: A | Discharge status: Home / Self Care | Payer: Self-pay | Source: Ambulatory Visit | Attending: Nurse Practitioner | Admitting: Nurse Practitioner

## 2022-03-10 VITALS — BP 118/76 | Ht 63.5 in | Wt 154.0 lb

## 2022-03-10 DIAGNOSIS — Z01419 Encounter for gynecological examination (general) (routine) without abnormal findings: Secondary | ICD-10-CM | POA: Insufficient documentation

## 2022-03-10 DIAGNOSIS — F419 Anxiety disorder, unspecified: Secondary | ICD-10-CM | POA: Diagnosis not present

## 2022-03-10 DIAGNOSIS — Z79890 Hormone replacement therapy (postmenopausal): Secondary | ICD-10-CM | POA: Diagnosis not present

## 2022-03-10 MED ORDER — CITALOPRAM HYDROBROMIDE 40 MG PO TABS: 40.0000 mg | ORAL_TABLET | Freq: Every day | ORAL | 3 refills | Status: DC

## 2022-03-10 MED ORDER — ESTRADIOL 1 MG PO TABS: 1.0000 mg | ORAL_TABLET | Freq: Every day | ORAL | 3 refills | Status: DC

## 2022-03-10 NOTE — Progress Notes (Signed)
   Mackenzie Thomas [birth year] [id number]   History:  56 y.o. G2P2002 presents for annual exam. Postmenopausal - no ERT. 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 Subsequent paps normal. Normal mammogram history. Anxiety/depression stable on Celexa. Takes an occasional Ambien 5 mg for insomnia. Xanax occasionally for anxiety and flying with work.   Gynecologic History No LMP recorded (lmp unknown). Patient has had a hysterectomy.   Contraception: status post hysterectomy Sexually active: Yes  Health maintenance Last Pap: 07/21/2020. Results were: ASCUS neg HR HPV Last mammogram: 05/06/2021. Results were: Normal Last colonoscopy: 06/28/2018. Results were: Normal, 10-year recall Last Dexa: Never  Past medical history, past surgical history, family history and social history were all reviewed and documented in the EPIC chart. 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 44 yo daughter, graduated from [employer], planning on [place]. Son sophomore at [employer].   ROS:  A ROS was performed and pertinent positives and negatives are included.  Exam:  Vitals:   03/10/22 1036  BP: 118/76  Weight: 154 lb (69.9 kg)  Height: 5' 3.5" (1.613 m)    Body mass index is 26.85 kg/m.  General appearance:  Normal Thyroid:  Symmetrical, normal in size, without palpable masses or nodularity. Respiratory  Auscultation:  Clear without wheezing or rhonchi Cardiovascular  Auscultation:  Regular rate, without rubs, murmurs or gallops  Edema/varicosities:  Not grossly evident Abdominal  Soft,nontender, without masses, guarding or rebound.  Liver/spleen:  No organomegaly noted  Hernia:  None appreciated  Skin  Inspection:  Grossly normal   Breasts: Examined lying and sitting.   Right: Without masses, retractions, discharge or axillary adenopathy.   Left: Without masses, retractions, discharge or axillary adenopathy. Gentitourinary   Inguinal/mons:  Normal without inguinal adenopathy  External  genitalia:  Normal  BUS/Urethra/Skene's glands:  Normal  Vagina:  Normal  Cervix:  Absent  Uterus:  Absent  Adnexa/parametria:     Rt: Without masses or tenderness.   Lt: Without masses or tenderness.  Anus and perineum: Normal  Digital rectal exam: Normal sphincter tone without palpated masses or tenderness  Assessment/Plan:  56 y.o. [id number] for annual exam.   Well female exam with routine gynecological exam - Education provided on SBEs, importance of preventative screenings, current guidelines, high calcium diet, regular exercise, and multivitamin daily. Labs with PCP.  Hormone replacement therapy (HRT) - Plan: estradiol (ESTRACE) 1 MG tablet daily with good management of menopausal symptoms. She is aware of the slight risks for blood clots, heart attack, stroke, and breast cancer. She would like to continue. Refill x 1 year provided.   Anxiety - Plan: ALPRAZolam (XANAX) 0.5 MG tablet. Takes 1/2 tablet occasionally for anxious mood and flying with work. She is aware of the addictive properties and recommendations to take sparingly. #30 with no refills provided. Does not need refills at this time.   Screening for cervical cancer - H/O 2014 LEEP HGSIL, ASCUS neg HR HPV in 2021, otherwise normal pap history since hysterectomy in 2018. Vaginal pap today.   Screening for breast cancer - Normal mammogram history. Continue annual screenings. Normal breast exam today.   Screening for colon cancer - Normal screening colonoscopy in 2019. Will repeat at GI's recommended interval.  Screening for osteoporosis - Average risk. Will plan DXA at age 57.   Follow up in 1 year for annual.       Olivia Mackie [facility], 11:01 AM 03/10/2022

## 2022-03-12 LAB — CYTOLOGY - PAP
Comment: NEGATIVE
Diagnosis: NEGATIVE
High risk HPV: NEGATIVE

## 2022-05-11 ENCOUNTER — Other Ambulatory Visit: Payer: Self-pay | Admitting: Nurse Practitioner

## 2022-05-11 DIAGNOSIS — F419 Anxiety disorder, unspecified: Secondary | ICD-10-CM

## 2022-05-11 NOTE — Telephone Encounter (Signed)
Last AEX 03/10/22

## 2022-05-13 ENCOUNTER — Encounter: Payer: Self-pay | Admitting: Nurse Practitioner

## 2022-08-31 ENCOUNTER — Other Ambulatory Visit: Payer: Self-pay | Admitting: Nurse Practitioner

## 2022-08-31 DIAGNOSIS — F5101 Primary insomnia: Secondary | ICD-10-CM

## 2022-08-31 DIAGNOSIS — F419 Anxiety disorder, unspecified: Secondary | ICD-10-CM

## 2022-09-01 NOTE — Telephone Encounter (Signed)
Last AEX 03/10/2022--nothing scheduled for 2024.

## 2022-11-25 ENCOUNTER — Other Ambulatory Visit: Payer: Self-pay | Admitting: Nurse Practitioner

## 2022-11-25 DIAGNOSIS — F419 Anxiety disorder, unspecified: Secondary | ICD-10-CM

## 2022-11-25 NOTE — Telephone Encounter (Signed)
AEX 03/10/2022

## 2022-12-16 ENCOUNTER — Other Ambulatory Visit: Payer: Self-pay | Admitting: Internal Medicine

## 2022-12-16 DIAGNOSIS — E785 Hyperlipidemia, unspecified: Secondary | ICD-10-CM

## 2023-03-15 ENCOUNTER — Other Ambulatory Visit: Payer: Self-pay | Admitting: Nurse Practitioner

## 2023-03-15 DIAGNOSIS — F5101 Primary insomnia: Secondary | ICD-10-CM

## 2023-03-15 DIAGNOSIS — F419 Anxiety disorder, unspecified: Secondary | ICD-10-CM

## 2023-03-16 ENCOUNTER — Other Ambulatory Visit: Payer: Self-pay

## 2023-03-16 DIAGNOSIS — Z79890 Hormone replacement therapy (postmenopausal): Secondary | ICD-10-CM

## 2023-03-16 DIAGNOSIS — F419 Anxiety disorder, unspecified: Secondary | ICD-10-CM

## 2023-03-16 MED ORDER — CITALOPRAM HYDROBROMIDE 40 MG PO TABS: 40.0000 mg | ORAL_TABLET | Freq: Every day | ORAL | 0 refills | Status: DC

## 2023-03-16 MED ORDER — ESTRADIOL 1 MG PO TABS: 1.0000 mg | ORAL_TABLET | Freq: Every day | ORAL | 0 refills | Status: DC

## 2023-03-16 NOTE — Telephone Encounter (Signed)
Medication refill request: xanax 0.5mg  Last AEX:  03/10/22 Next AEX: not scheduled. Note sent to pharmacy for patient to schedule.  Last MMG (if hormonal medication request): n/a Refill authorized: #30 with 0 rf pended for today     Medication refill request: Ambien  Last AEX:  03/10/22 Next AEX: not scheduled. Note sent to pharmacy for patient to schedule.  Last MMG (if hormonal medication request): n/a Refill authorized: #30 with 0 rf pended for today

## 2023-03-16 NOTE — Telephone Encounter (Addendum)
Medication refill request: estradiol 1mg  tablet & citalopram 40mg  Last AEX:  03-10-22 Next AEX: 03-21-23 Last MMG (if hormonal medication request): 05-13-22 birads 1:neg Refill authorized: please approve if appropriate

## 2023-06-29 ENCOUNTER — Other Ambulatory Visit: Payer: Self-pay

## 2023-06-29 DIAGNOSIS — F5101 Primary insomnia: Secondary | ICD-10-CM

## 2023-06-29 DIAGNOSIS — F419 Anxiety disorder, unspecified: Secondary | ICD-10-CM

## 2023-06-29 NOTE — Telephone Encounter (Signed)
Med refill request: Ambien/Xanax Last AEX: 03/10/2022-TW Next AEX: none, recall placed for 02/2023 per EMR. Msg sent to appt desk.  Last MMG (if hormonal med): n/a Refill authorized: rx pend.

## 2023-07-05 ENCOUNTER — Other Ambulatory Visit: Payer: Self-pay

## 2023-07-05 DIAGNOSIS — F419 Anxiety disorder, unspecified: Secondary | ICD-10-CM

## 2023-07-05 MED ORDER — ALPRAZOLAM 0.5 MG PO TABS
ORAL_TABLET | ORAL | 0 refills | Status: DC
Start: 2023-07-05 — End: 2024-05-15

## 2023-07-05 MED ORDER — ZOLPIDEM TARTRATE 10 MG PO TABS: 10.0000 mg | ORAL_TABLET | Freq: Every day | ORAL | 0 refills | Status: DC

## 2023-07-05 NOTE — Telephone Encounter (Signed)
Now scheduled for 09/19/2023-TW

## 2023-07-05 NOTE — Telephone Encounter (Signed)
Medication refill request: alprazolam 0.5mg   Last AEX:  03/10/22  Next AEX: 09/29/23 Last MMG (if hormonal medication request): n/a Refill authorized: #30 with 0 rf pended for today

## 2023-09-29 ENCOUNTER — Ambulatory Visit: Payer: Self-pay | Admitting: Nurse Practitioner

## 2023-10-11 ENCOUNTER — Other Ambulatory Visit: Payer: Self-pay

## 2023-10-11 DIAGNOSIS — F419 Anxiety disorder, unspecified: Secondary | ICD-10-CM

## 2023-10-11 MED ORDER — CITALOPRAM HYDROBROMIDE 40 MG PO TABS: 40.0000 mg | ORAL_TABLET | Freq: Every day | ORAL | 0 refills | Status: DC

## 2023-10-11 NOTE — Telephone Encounter (Signed)
Med refill request:Citalopram Last AEX: 03/10/2022 Next AEX: 11/08/2023 Last MMG (if hormonal med) 05/13/2022 Refill authorized: Last Rx sent #90 with zero refills on 03/16/2023. Please approve or deny as appropriate.

## 2023-11-02 ENCOUNTER — Other Ambulatory Visit: Payer: Self-pay

## 2023-11-02 DIAGNOSIS — Z79890 Hormone replacement therapy (postmenopausal): Secondary | ICD-10-CM

## 2023-11-02 MED ORDER — ESTRADIOL 1 MG PO TABS: 1.0000 mg | ORAL_TABLET | Freq: Every day | ORAL | 0 refills | Status: DC

## 2023-11-02 NOTE — Telephone Encounter (Signed)
 Med refill request: estrace Last AEX: 03/10/22 Next AEX: 11/08/23 Last MMG (if hormonal med) 05/13/22 Refill authorized: Please Advise, #90, 0 RF

## 2023-11-08 ENCOUNTER — Ambulatory Visit: Payer: Managed Care, Other (non HMO) | Admitting: Nurse Practitioner

## 2023-11-08 NOTE — Progress Notes (Deleted)
 Mackenzie Thomas 07/13/1966 [id number]   History:  58 y.o. G2P2002 presents for annual exam. Postmenopausal - on HRT. 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 Subsequent paps normal. Normal mammogram history. Anxiety/depression stable on Celexa. Takes an occasional Ambien 5 mg for insomnia. Xanax occasionally for anxiety and flying with work.   Gynecologic History No LMP recorded (lmp unknown). Patient has had a hysterectomy.   Contraception: status post hysterectomy Sexually active: Yes  Health maintenance Last Pap: 03/10/2022 (vaginal). Results were: Normal neg HR HPV, 3-year repeat Last mammogram: 05/12/2022. Results were: Normal Last colonoscopy: 06/28/2018. Results were: Normal, 10-year recall Last Dexa: Never  Past medical history, past surgical history, family history and social history were all reviewed and documented in the EPIC chart. 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 Son sophomore at [employer].   ROS:  A ROS was performed and pertinent positives and negatives are included.  Exam:  There were no vitals filed for this visit.   There is no height or weight on file to calculate BMI.  General appearance:  Normal Thyroid:  Symmetrical, normal in size, without palpable masses or nodularity. Respiratory  Auscultation:  Clear without wheezing or rhonchi Cardiovascular  Auscultation:  Regular rate, without rubs, murmurs or gallops  Edema/varicosities:  Not grossly evident Abdominal  Soft,nontender, without masses, guarding or rebound.  Liver/spleen:  No organomegaly noted  Hernia:  None appreciated  Skin  Inspection:  Grossly normal   Breasts: Examined lying and sitting.   Right: Without masses, retractions, discharge or axillary adenopathy.   Left: Without masses, retractions, discharge or axillary adenopathy. Pelvic: External genitalia:  no lesions              Urethra:  normal appearing urethra  with no masses, tenderness or lesions              Bartholins and Skenes: normal                 Vagina: normal appearing vagina with normal color and discharge, no lesions              Cervix: absent Bimanual Exam:  Uterus:  absent              Adnexa: no mass, fullness, tenderness              Rectovaginal: Deferred              Anus:  normal, no lesions  Assessment/Plan:  58 y.o. [id number] for annual exam.   Well female exam with routine gynecological exam - Education provided on SBEs, importance of preventative screenings, current guidelines, high calcium diet, regular exercise, and multivitamin daily. Labs with PCP.  Hormone replacement therapy (HRT) - Plan: estradiol (ESTRACE) 1 MG tablet daily with good management of menopausal symptoms. She is aware of the slight risks for blood clots, heart attack, stroke, and breast cancer. She would like to continue. Refill x 1 year provided.   Anxiety - Plan: ALPRAZolam (XANAX) 0.5 MG tablet. Takes 1/2 tablet occasionally for anxious mood and flying with work. She is aware of the addictive properties and recommendations to take sparingly. #30 with no refills provided. Does not need refills at this time.   Screening for cervical cancer - H/O 2014 LEEP HGSIL, ASCUS neg HR HPV in 2021, otherwise normal pap history since hysterectomy in 2018. Will repeat at 3-year interval per guidelines.   Screening for breast  cancer - Normal mammogram history. Continue annual screenings. Normal breast exam today.   Screening for colon cancer - Normal screening colonoscopy in 2019. Will repeat at GI's recommended interval.  Screening for osteoporosis - Average risk. Will plan DXA at age 58.   No follow-ups on file.      Olivia Mackie [facility], 12:39 PM 11/08/2023

## 2023-11-28 IMAGING — CT CT CARDIAC CORONARY ARTERY CALCIUM SCORE
3 series · 13 of 20 positions shown, 15 images · non-contrast
Comparison: No priors.

CLINICAL DATA: 55-year-old Caucasian female former smoker with
family history of coronary artery disease.

EXAM:
CT CARDIAC CORONARY ARTERY CALCIUM SCORE
TECHNIQUE: Non-contrast imaging through the heart was performed using
prospective ECG gating. Image post processing was performed on an
independent workstation, allowing for quantitative analysis of the
heart and coronary arteries. Note that this exam targets the heart
and the chest was not imaged in its entirety.

[Series 2: calcium scoring 2.00 qr36 bestdiast 63% hrt calciu · axial · 0.33mm/px · z∈[+1519,+1577]mm · 3 of 73 slices shown]
[im 15/73  vessel]
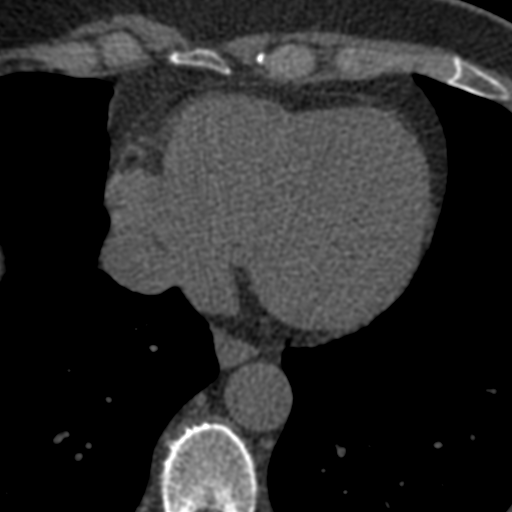
[im 29/73  vessel]
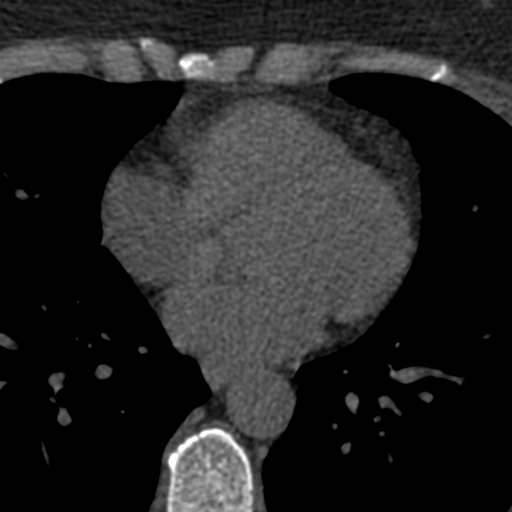
[im 44/73  vessel]
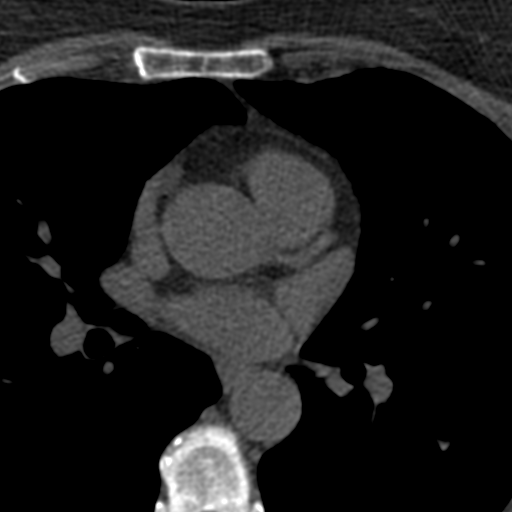

[Series 3: calcium scoring 2.00 br40 bestdiast 63% axial · axial · 0.54mm/px · z∈[+1509,+1609]mm · 5 of 76 slices shown, 7 images]
[im 13/76  vessel]
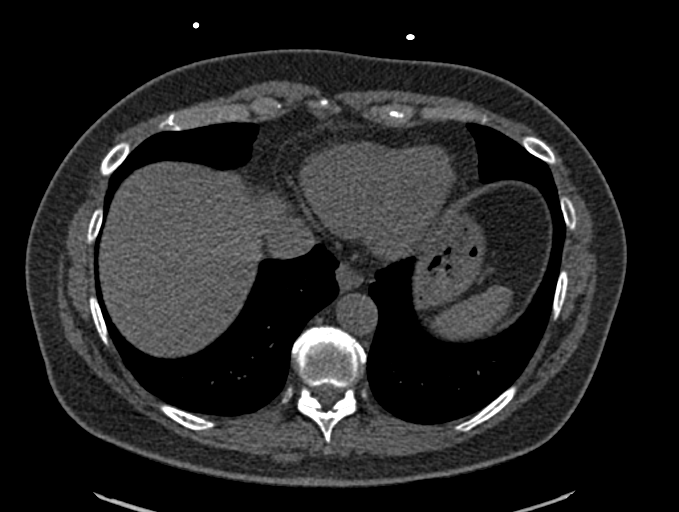
[im 13/76  lung]
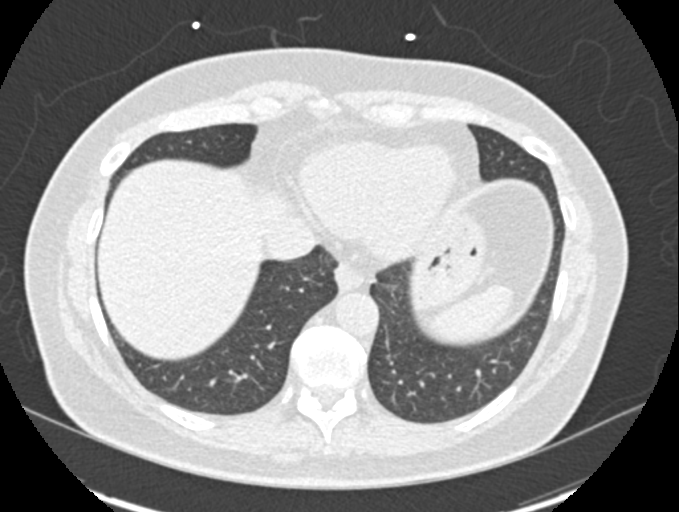
[im 26/76  vessel]
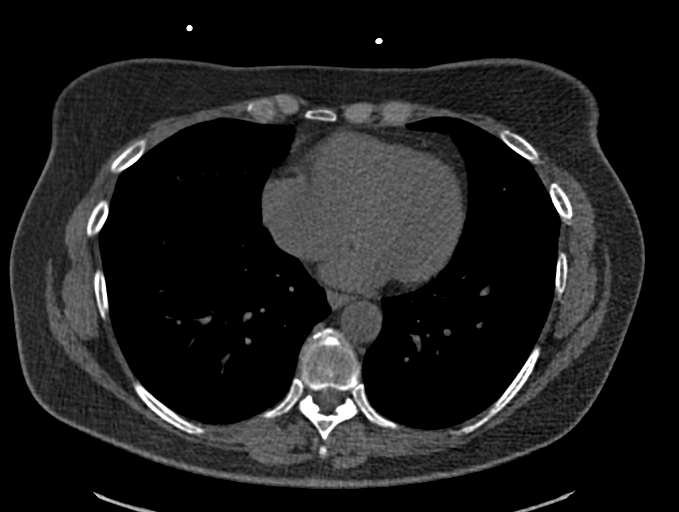
[im 38/76  vessel]
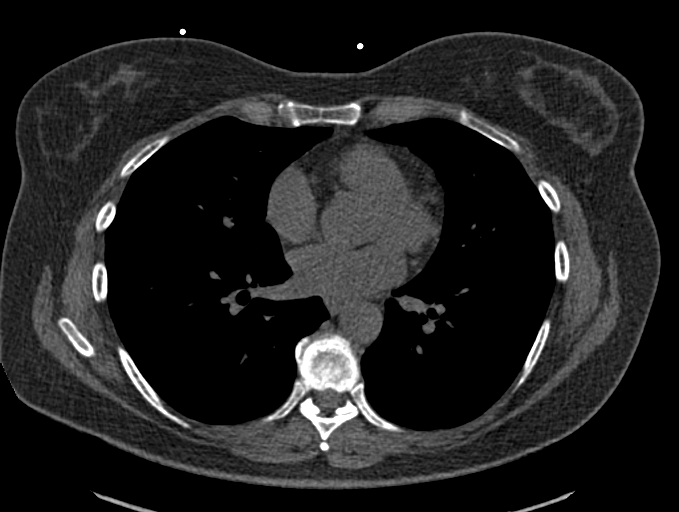
[im 51/76  vessel]
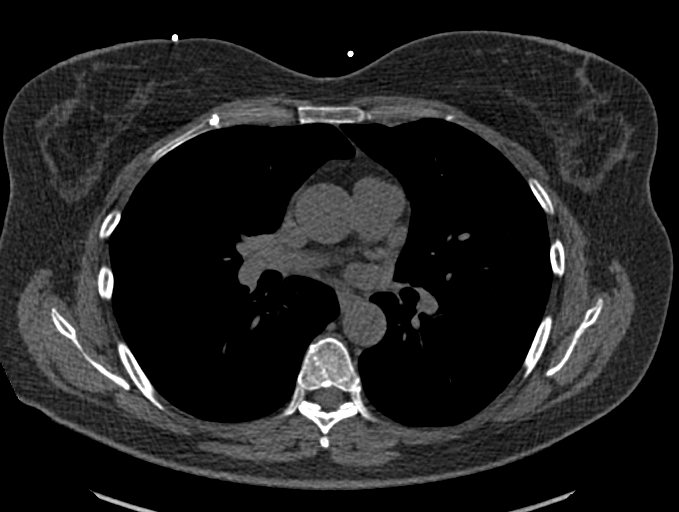
[im 63/76  vessel]
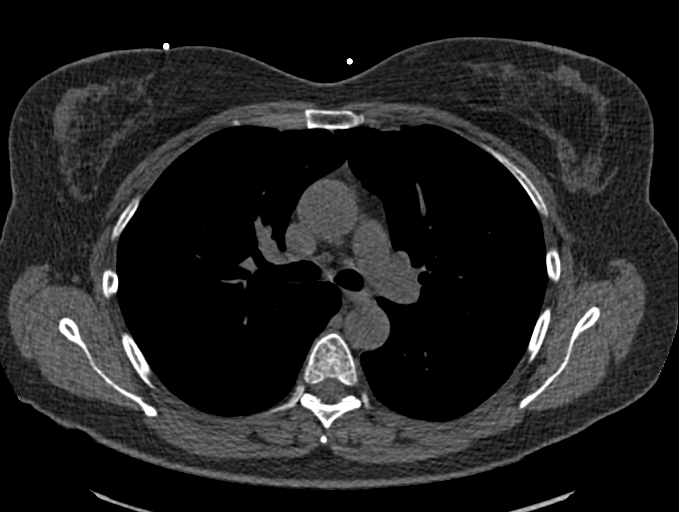
[im 63/76  lung]
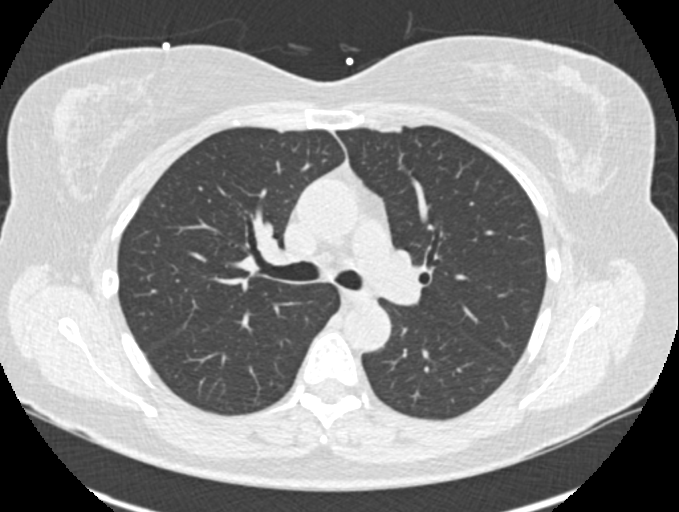

[Series 9: calcium scoring 2.00 br60 bestdiast 63% lungs · axial · 0.54mm/px · z∈[+1509,+1609]mm · 5 of 76 slices shown]
[im 13/76  vessel]
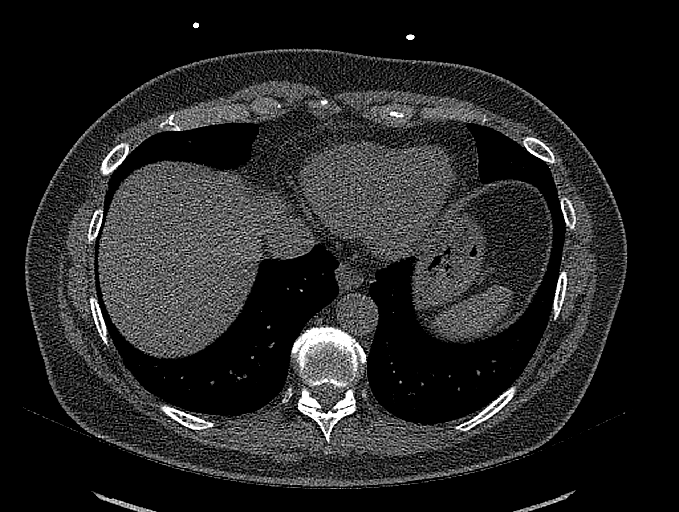
[im 26/76  vessel]
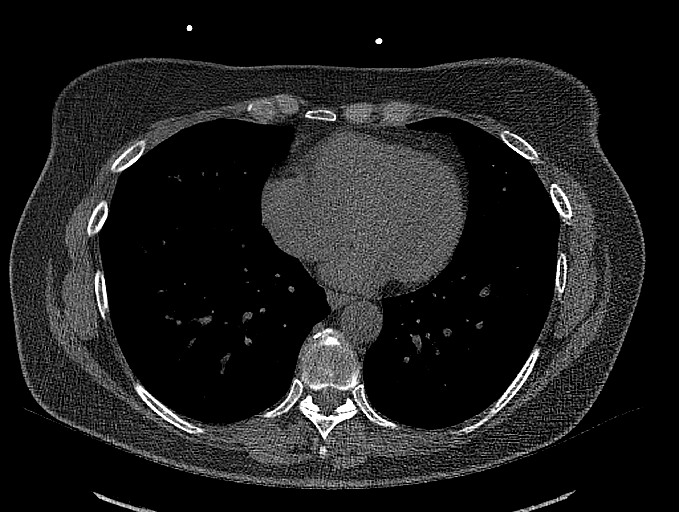
[im 38/76  vessel]
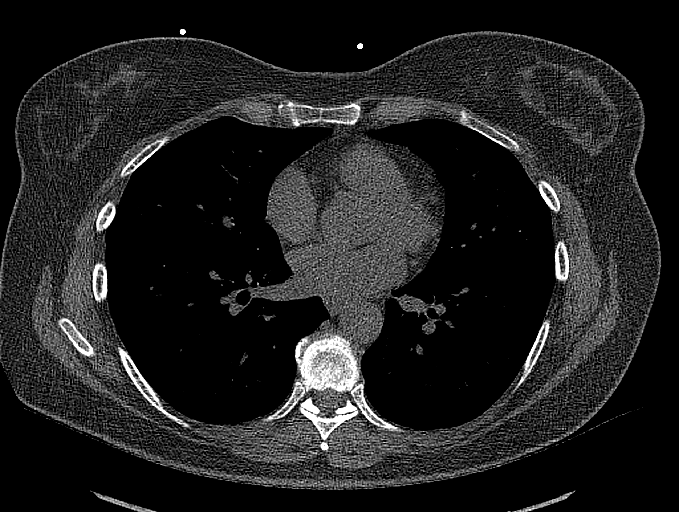
[im 51/76  vessel]
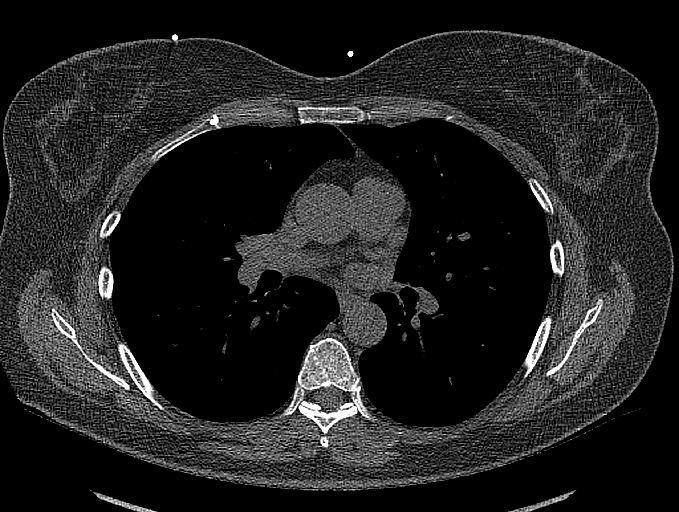
[im 63/76  vessel]
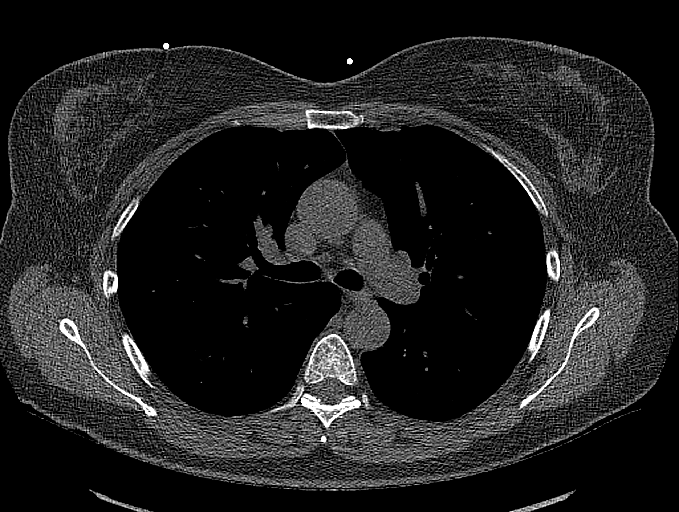

[13 of 20 positions shown; findings below may reference images not displayed]

FINDINGS: CORONARY CALCIUM SCORES:

Left Main: 0

LAD: 0

LCx: 0

RCA: 0

Total Agatston Score: 0

[HOSPITAL] percentile: N/A

AORTA MEASUREMENTS:

Ascending Aorta: 33 mm

Descending Aorta: 23 mm

EXTRACARDIAC FINDINGS:

Within the visualized portions of the thorax there are no suspicious
appearing pulmonary nodules or masses, there is no acute
consolidative airspace disease, no pleural effusions, no
pneumothorax and no lymphadenopathy. Visualized portions of the
upper abdomen are unremarkable. There are no aggressive appearing
lytic or blastic lesions noted in the visualized portions of the
skeleton.
IMPRESSION: 1. Patient's total coronary artery calcium score is 0, which
indicates a very low (but nonzero) risk of major adverse
cardiovascular events over the next 10 years.
2. No significant incidental noncardiac findings are noted.

## 2024-01-10 ENCOUNTER — Other Ambulatory Visit: Payer: Self-pay | Admitting: *Deleted

## 2024-01-10 DIAGNOSIS — Z79890 Hormone replacement therapy (postmenopausal): Secondary | ICD-10-CM

## 2024-01-10 MED ORDER — ESTRADIOL 1 MG PO TABS
1.0000 mg | ORAL_TABLET | Freq: Every day | ORAL | 0 refills | Status: DC
Start: 2024-01-10 — End: 2024-01-25

## 2024-01-10 NOTE — Telephone Encounter (Signed)
 Incoming call from patient.   Patient states Rx estradiol  was denied from pharmacy. Patient states she is packed an ready to go out of town. Out of medication.   Advised patient last AEX 03/10/22, updated AEX needed for refills. Patient is scheduled for AEX 01/25/24.   Advised patient I will send request to provider to review, advised will need to keep AEX any further refills. Patient asking if this will be completed by end of day? Advised Jyl Or is seeing patients, response may not be immediate, we request 48-72 hours for refill request.   Patient states she will come into office to request, advised she could come to the office; however, this would not change the process for the refill request. Advised patient I will f/u once request has been reviewed.   Rx pended for 30 day supply.   Tiffany -please review and advise.

## 2024-01-10 NOTE — Telephone Encounter (Signed)
 Call returned to patient to notify of refill. Advised if traveling can have Rx transferred to pharmacy of choice, Rx was sent to [facility]. Return call to office if any additional questions, office phones go off at 4:15PM, return on at 0800.

## 2024-01-25 ENCOUNTER — Ambulatory Visit: Payer: Managed Care, Other (non HMO) | Admitting: Nurse Practitioner

## 2024-01-25 ENCOUNTER — Other Ambulatory Visit: Payer: Self-pay | Admitting: Nurse Practitioner

## 2024-01-25 DIAGNOSIS — Z79890 Hormone replacement therapy (postmenopausal): Secondary | ICD-10-CM

## 2024-01-26 NOTE — Telephone Encounter (Signed)
 Medication refill request: estrace  1mg  tablet Last AEX:  05-15-24 Next AEX: 03-10-22 Last MMG (if hormonal medication request): 05-12-22 birads 1:neg Refill authorized: per patient she had to r/s her appt due to illness & is now scheduled for 9/25. Please approve or deny as appropriate

## 2024-01-27 MED ORDER — ESTRADIOL 1 MG PO TABS: 1.0000 mg | ORAL_TABLET | Freq: Every day | ORAL | 0 refills | Status: DC

## 2024-04-24 ENCOUNTER — Other Ambulatory Visit: Payer: Self-pay | Admitting: Internal Medicine

## 2024-04-24 DIAGNOSIS — E785 Hyperlipidemia, unspecified: Secondary | ICD-10-CM

## 2024-04-30 ENCOUNTER — Encounter: Payer: Self-pay | Admitting: Nurse Practitioner

## 2024-04-30 ENCOUNTER — Other Ambulatory Visit: Payer: Self-pay

## 2024-05-03 ENCOUNTER — Other Ambulatory Visit: Payer: Self-pay

## 2024-05-08 ENCOUNTER — Ambulatory Visit
Admission: RE | Admit: 2024-05-08 | Discharge: 2024-05-08 | Disposition: A | Discharge status: Home / Self Care | Payer: Self-pay | Source: Ambulatory Visit | Attending: Internal Medicine | Admitting: Internal Medicine

## 2024-05-08 DIAGNOSIS — E785 Hyperlipidemia, unspecified: Secondary | ICD-10-CM

## 2024-05-15 ENCOUNTER — Encounter: Payer: Self-pay | Admitting: Nurse Practitioner

## 2024-05-15 ENCOUNTER — Ambulatory Visit (INDEPENDENT_AMBULATORY_CARE_PROVIDER_SITE_OTHER): Admitting: Nurse Practitioner

## 2024-05-15 VITALS — BP 120/78 | HR 59 | Ht 64.0 in | Wt 145.0 lb

## 2024-05-15 DIAGNOSIS — Z1331 Encounter for screening for depression: Secondary | ICD-10-CM | POA: Diagnosis not present

## 2024-05-15 DIAGNOSIS — F5101 Primary insomnia: Secondary | ICD-10-CM

## 2024-05-15 DIAGNOSIS — Z01419 Encounter for gynecological examination (general) (routine) without abnormal findings: Secondary | ICD-10-CM

## 2024-05-15 DIAGNOSIS — F419 Anxiety disorder, unspecified: Secondary | ICD-10-CM

## 2024-05-15 DIAGNOSIS — Z79890 Hormone replacement therapy (postmenopausal): Secondary | ICD-10-CM

## 2024-05-15 MED ORDER — ALPRAZOLAM 0.5 MG PO TABS: ORAL_TABLET | ORAL | 0 refills | Status: DC

## 2024-05-15 MED ORDER — ZOLPIDEM TARTRATE 10 MG PO TABS
10.0000 mg | ORAL_TABLET | Freq: Every day | ORAL | 0 refills | Status: AC
Start: 2024-05-15 — End: ?

## 2024-05-15 MED ORDER — ESTRADIOL 0.05 MG/24HR TD PTWK: 0.0500 mg | MEDICATED_PATCH | TRANSDERMAL | 3 refills | Status: DC

## 2024-05-15 MED ORDER — CITALOPRAM HYDROBROMIDE 40 MG PO TABS: 40.0000 mg | ORAL_TABLET | Freq: Every day | ORAL | 3 refills | Status: AC

## 2024-05-15 NOTE — Progress Notes (Signed)
 Mackenzie Thomas 05-Aug-1966 [id number]   History:  58 y.o. G2P2002 presents for annual exam. Postmenopausal - on oral ERT. 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 Subsequent paps normal. Normal mammogram history. Anxiety/depression stable on Celexa . Takes an occasional Ambien  5 mg for insomnia. Xanax  occasionally for anxiety and flying for work.   Gynecologic History No LMP recorded (lmp unknown). Patient has had a hysterectomy.   Contraception: status post hysterectomy Sexually active: Yes  Health maintenance Last Pap: 03/05/2022. Results were: Normal neg HR HPV Last mammogram: 04/2023. Results were: Normal Last colonoscopy: 06/28/2018. Results were: Normal, 10-year recall Last Dexa: Never     05/15/2024    3:08 PM  Depression screen PHQ 2/9  Decreased Interest 0  Down, Depressed, Hopeless 0  PHQ - 2 Score 0     Past medical history, past surgical history, family history and social history were all reviewed and documented in the EPIC chart. 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 Son at [employer] for [employer].   ROS:  A ROS was performed and pertinent positives and negatives are included.  Exam:  Vitals:   05/15/24 1500  BP: 120/78  Pulse: (!) 59  SpO2: 98%  Weight: 145 lb (65.8 kg)  Height: 5' 4 (1.626 m)    Body mass index is 24.89 kg/m.  General appearance:  Normal Thyroid:  Symmetrical, normal in size, without palpable masses or nodularity. Respiratory  Auscultation:  Clear without wheezing or rhonchi Cardiovascular  Auscultation:  Regular rate, without rubs, murmurs or gallops  Edema/varicosities:  Not grossly evident Abdominal  Soft,nontender, without masses, guarding or rebound.  Liver/spleen:  No organomegaly noted  Hernia:  None appreciated  Skin  Inspection:  Grossly normal   Breasts: Examined lying and sitting.   Right: Without masses,  retractions, discharge or axillary adenopathy.   Left: Without masses, retractions, discharge or axillary adenopathy. Pelvic: External genitalia:  no lesions              Urethra:  normal appearing urethra with no masses, tenderness or lesions              Bartholins and Skenes: normal                 Vagina: normal appearing vagina with normal color and discharge, no lesions              Cervix: absent Bimanual Exam:  Uterus: absent              Adnexa: no mass, fullness, tenderness              Rectovaginal: Deferred              Anus:  normal, no lesions  Kari Leaven, CMA present as chaperone.   Assessment/Plan:  58 y.o. [id number] for annual exam.   Well female exam with routine gynecological exam - Education provided on SBEs, importance of preventative screenings, current guidelines, high calcium diet, regular exercise, and multivitamin daily. Labs with PCP.  Postmenopausal hormone therapy - Plan: estradiol  (CLIMARA ) 0.05 mg/24hr patch weekly. Will switch from oral to transdermal for safety profile. Educated on proper use.   Anxiety - Plan: citalopram  (CELEXA ) 40 MG tablet daily, ALPRAZolam  (XANAX ) 0.5 MG tablet as needed (takes rarely, mostly when flying).   Primary insomnia - Plan: zolpidem  (AMBIEN ) 10 MG tablet as needed for insomnia. Takes rarely, mostly when traveling  for work.   Screening for cervical cancer - H/O 2014 LEEP HGSIL, ASCUS neg HR HPV in 2021, otherwise normal pap history since hysterectomy in 2018. Will repeat at 3-year interval per guidelines.   Screening for breast cancer - Normal mammogram history. Continue annual screenings. Normal breast exam today.   Screening for colon cancer - Normal screening colonoscopy in 2019. Will repeat at GI's recommended interval.  Screening for osteoporosis - Average risk. Will plan DXA at age 62.   Return in about 1 year (around 05/15/2025) for Annual.      Annabella DELENA [facility], 3:41 PM 05/15/2024

## 2024-05-28 LAB — HM MAMMOGRAPHY

## 2024-06-04 ENCOUNTER — Encounter: Payer: Self-pay | Admitting: Nurse Practitioner

## 2024-07-09 ENCOUNTER — Encounter: Payer: Self-pay | Admitting: Nurse Practitioner

## 2024-07-10 ENCOUNTER — Other Ambulatory Visit: Payer: Self-pay | Admitting: Nurse Practitioner

## 2024-07-10 DIAGNOSIS — Z79890 Hormone replacement therapy (postmenopausal): Secondary | ICD-10-CM

## 2024-07-10 MED ORDER — ESTRADIOL 1 MG PO TABS
1.0000 mg | ORAL_TABLET | Freq: Every day | ORAL | 3 refills | Status: AC
Start: 2024-07-10 — End: ?

## 2024-07-27 ENCOUNTER — Other Ambulatory Visit: Payer: Self-pay | Admitting: Nurse Practitioner

## 2024-07-27 DIAGNOSIS — F419 Anxiety disorder, unspecified: Secondary | ICD-10-CM

## 2024-07-27 NOTE — Telephone Encounter (Signed)
 Med refill request:    ALPRAZolam  (XANAX ) 0.5 MG tablet  Start:  05/15/24 Disp:  30 tablets Refills:  0  Last AEX:  05/15/24 Next AEX:  Not yet scheduled Last MMG (if hormonal med):  N/A Refill authorized? Please Advise.
